# Patient Record
Sex: Male | Born: 1980 | Race: White | Hispanic: No | Marital: Married | Smoking: Former smoker
Health system: Southern US, Community
[De-identification: ages and names within clinical notes are randomized; demographics above are authoritative.]

## PROBLEM LIST (undated history)

## (undated) ENCOUNTER — Emergency Department: Payer: Medicaid Other | Source: Home / Self Care

## (undated) DIAGNOSIS — S61419A Laceration without foreign body of unspecified hand, initial encounter: Secondary | ICD-10-CM

## (undated) HISTORY — DX: Laceration without foreign body of unspecified hand, initial encounter: S61.419A

## (undated) HISTORY — PX: HAND SURGERY: SHX662

---

## 2016-09-12 ENCOUNTER — Emergency Department
Admission: EM | Admit: 2016-09-12 | Discharge: 2016-09-12 | Disposition: A | Discharge status: Home / Self Care | Payer: Medicaid Other | Attending: Emergency Medicine | Admitting: Emergency Medicine

## 2016-09-12 ENCOUNTER — Emergency Department: Payer: Medicaid Other

## 2016-09-12 ENCOUNTER — Encounter: Payer: Self-pay | Admitting: Intensive Care

## 2016-09-12 DIAGNOSIS — M5410 Radiculopathy, site unspecified: Secondary | ICD-10-CM

## 2016-09-12 DIAGNOSIS — M79604 Pain in right leg: Secondary | ICD-10-CM | POA: Diagnosis not present

## 2016-09-12 DIAGNOSIS — Z87891 Personal history of nicotine dependence: Secondary | ICD-10-CM | POA: Diagnosis not present

## 2016-09-12 MED ORDER — PREDNISONE 20 MG PO TABS
60.0000 mg | ORAL_TABLET | Freq: Once | ORAL | Status: AC
  Administered 2016-09-12: 60 mg via ORAL
  Filled 2016-09-12: qty 3

## 2016-09-12 MED ORDER — PREDNISONE 10 MG PO TABS: ORAL_TABLET | ORAL | 0 refills | Status: AC

## 2016-09-12 MED ORDER — CYCLOBENZAPRINE HCL 5 MG PO TABS: 5.0000 mg | ORAL_TABLET | Freq: Every day | ORAL | 0 refills | Status: DC

## 2016-09-12 NOTE — ED Notes (Signed)
Pt reports right leg pain at the calf for the last 3-4 weeks with cramping, states worse as of late at the medial calf, unable to dorsiflex foot,

## 2016-09-12 NOTE — ED Provider Notes (Signed)
[facility] Emergency Department Provider Note ____________________________________________  Time seen: 2030  I have reviewed the triage vital signs and the nursing notes.  HISTORY  Chief Complaint  Leg Pain (right)  HPI Dwayne Adams is a 36 y.o. male presents to the ED, at the urging of his wife, for evaluation of 3-4 week complaint of right lateral calf pain. Patient also describes some discomfort to the lateral right thigh. He does note that his pain is worse with ambulation. He also notes that he has difficulty raising his toes when he walks. He said a few occasions where he stubbed his toe, and fall on a nearly fallen while walking over the last 3 weeks. He denies any injury, accident, or trauma. He denies any distal paresthesias or foot drop. The patient is here with concerns for a blood clot to his lower leg. He was firstaware of his difficulty with raising the foot, when he was driving, had a tough time articulating his foot to the gas and brake pedals. He gives a remote history of a L1 lumbar compression fracture about 18 years prior. The injury occurred after a trampoline accident. He suffered no lasting sequelae. He reports being in a clamshell brace for about 8 months. No surgical intervention and no ongoing pain management.   History reviewed. No pertinent past medical history.  There are no active problems to display for this patient.  Past Surgical History:  Procedure Laterality Date  . HAND SURGERY Right     Prior to Admission medications   Medication Sig Start Date End Date Taking? Authorizing Provider  cyclobenzaprine (FLEXERIL) 5 MG tablet Take 1 tablet (5 mg total) by mouth at bedtime. 09/12/16   Delquan Poucher, Charlesetta IvoryJenise V Bacon, PA-C  predniSONE (DELTASONE) 10 MG tablet Take 5 tabs on Day 1 Take 4 tabs on Day 2 Take 3 tabs on Day 3 Take 2 tabs on Day 5  Take 1 tab on Day 6 09/12/16 09/18/16  Zadie Deemer, Charlesetta IvoryJenise V Bacon, PA-C    Allergies Patient has no  known allergies.  History reviewed. No pertinent family history.  Social History Social History  Substance Use Topics  . Smoking status: Former Smoker    Types: Cigarettes  . Smokeless tobacco: Never Used  . Alcohol use No    Review of Systems  Constitutional: Negative for fever. Respiratory: Negative for shortness of breath. Gastrointestinal: Negative for abdominal pain, vomiting and diarrhea. Musculoskeletal: Negative for back pain. Right leg and calf pain as above.  Skin: Negative for rash. Neurological: Negative for headaches, focal weakness or numbness. ____________________________________________  PHYSICAL EXAM:  VITAL SIGNS: ED Triage Vitals  Enc Vitals Group     BP 09/12/16 1818 119/87     Pulse Rate 09/12/16 1818 79     Resp 09/12/16 1818 16     Temp 09/12/16 1818 98.1 F (36.7 C)     Temp Source 09/12/16 1818 Oral     SpO2 09/12/16 1818 98 %     Weight 09/12/16 1817 151 lb (68.5 kg)     Height 09/12/16 1817 6' (1.829 m)     Head Circumference --      Peak Flow --      Pain Score 09/12/16 1816 2     Pain Loc --      Pain Edu? --      Excl. in GC? --     Constitutional: Alert and oriented. Well appearing and in no distress. Head: Normocephalic and atraumatic. Cardiovascular: Normal rate,  regular rhythm. Normal distal pulses. Respiratory: Normal respiratory effort. No wheezes/rales/rhonchi. Musculoskeletal: Right lower extremity without any obvious deformity, dislocation, or effusion. Patient with normal knee exam without internal derangement. He is also noted to have normal palpable calf and Achilles exam. Negative Thompson test. Patient is mildly tender palpation to the anterior tibialis of the right lotion many. He has normal dorsiflexion of the foot, but diminished plantar flexion. Nontender with normal range of motion in all extremities.  Neurologic: Cranial nerves II through XII grossly intact. Normal LE DTRs bilaterally. Antalgic gait without ataxia.  Normal speech and language. No gross focal neurologic deficits are appreciated. Skin:  Skin is warm, dry and intact. No rash noted. ____________________________________________   RADIOLOGY  RLE Ultrasound/Doppler  IMPRESSION: No evidence of DVT within the right lower extremity.  CT Lumbar Spine  IMPRESSION: 1. Six lumbar type non-rib-bearing vertebral bodies. 2. L1 age-indeterminate superior compression deformity with 40% anterior loss of height. No bony retropulsion. 3. Mild discogenic degenerative changes at L4-5 through L6-S1. No significant bony or foraminal stenosis identified. ____________________________________________  PROCEDURES  Decadron 60 mg PO ____________________________________________  INITIAL IMPRESSION / ASSESSMENT AND PLAN / ED COURSE  Patient was ED evaluation of radicular LE symptoms on the right leg. His pain to the anterior tibialis musculature and decreased dorsiflexion of the right foot. Symptoms may represent a nerve impingement from the lumbar spine. A CT scan is overall reassuring at this time. The patient has a congenital sixth lumbar vertebrate. He has a stable L1 compression wedge fracture and only mild degenerative changes from L4-5 through L6-S1.He will be discharged with a prescription for Decadron to dose the taper over the next 5 days. He is also given a perception for cyclobenzaprine to dose for muscle spasm. He is given crutches to help with ambulation, and an Ace bandage to the calf and ankle for support. He is advised to [facility] or return to the ED in 1 week if symptoms do not improve significantly. ____________________________________________  FINAL CLINICAL IMPRESSION(S) / ED DIAGNOSES  Final diagnoses:  Right leg pain  Radicular pain of right lower extremity      Maxie Debose, Charlesetta IvoryJenise V Bacon, PA-C 09/13/16 0103    Minna AntisPaduchowski, Kevin, MD 09/15/16 [phone]

## 2016-09-12 NOTE — ED Triage Notes (Addendum)
Patient presents today with R calf pain and R thigh X 3-4 weeks. Denies injury. Reports pain is worse with ambulating. No redness or warmth noted

## 2016-09-12 NOTE — Discharge Instructions (Signed)
Your symptoms are consistent with some irritation to the nerves that supply your foot and ankle. You should take the prescription steroids as directed and muscle relaxant as needed. Follow[facility] for continued symptoms. Return to the ED during the week for worsening symptoms.

## 2017-02-10 DIAGNOSIS — S61419A Laceration without foreign body of unspecified hand, initial encounter: Secondary | ICD-10-CM

## 2017-02-10 HISTORY — DX: Laceration without foreign body of unspecified hand, initial encounter: S61.419A

## 2017-02-25 ENCOUNTER — Encounter: Payer: Self-pay | Admitting: Emergency Medicine

## 2017-02-25 ENCOUNTER — Emergency Department
Admission: EM | Admit: 2017-02-25 | Discharge: 2017-02-25 | Disposition: A | Discharge status: Home / Self Care | Payer: Worker's Compensation | Attending: Emergency Medicine | Admitting: Emergency Medicine

## 2017-02-25 DIAGNOSIS — F17290 Nicotine dependence, other tobacco product, uncomplicated: Secondary | ICD-10-CM | POA: Diagnosis not present

## 2017-02-25 DIAGNOSIS — X58XXXD Exposure to other specified factors, subsequent encounter: Secondary | ICD-10-CM | POA: Insufficient documentation

## 2017-02-25 DIAGNOSIS — S61412D Laceration without foreign body of left hand, subsequent encounter: Secondary | ICD-10-CM | POA: Diagnosis not present

## 2017-02-25 DIAGNOSIS — S61411D Laceration without foreign body of right hand, subsequent encounter: Secondary | ICD-10-CM | POA: Insufficient documentation

## 2017-02-25 DIAGNOSIS — S61411A Laceration without foreign body of right hand, initial encounter: Secondary | ICD-10-CM

## 2017-02-25 DIAGNOSIS — M79642 Pain in left hand: Secondary | ICD-10-CM | POA: Diagnosis present

## 2017-02-25 MED ORDER — OXYCODONE-ACETAMINOPHEN 5-325 MG PO TABS
1.0000 | ORAL_TABLET | Freq: Once | ORAL | Status: AC
  Administered 2017-02-25: 1 via ORAL
  Filled 2017-02-25: qty 1

## 2017-02-25 MED ORDER — TRAMADOL HCL 50 MG PO TABS: 50.0000 mg | ORAL_TABLET | Freq: Four times a day (QID) | ORAL | 0 refills | Status: AC | PRN

## 2017-02-25 NOTE — ED Provider Notes (Signed)
[facility] Emergency Department Provider Note  ____________________________________________  Time seen: Approximately 10:28 AM  I have reviewed the triage vital signs and the nursing notes.   HISTORY  Chief Complaint Laceration    HPI Dwayne Adams is a 37 y.o. male that presents to the emergency department for dressing change.  Patient cut his hand yesterday while at work.  [facility] with [facility].  Patient states that wounds are painful and was afraid to have his wife change his dressings.  He has been taking her 800 mg tablets of ibuprofen, which is not controlling the pain. He is taking doxycycline.   No numbness, tingling.  History reviewed. No pertinent past medical history.  There are no active problems to display for this patient.   Past Surgical History:  Procedure Laterality Date  . HAND SURGERY Right     Prior to Admission medications   Medication Sig Start Date End Date Taking? Authorizing Provider  cyclobenzaprine (FLEXERIL) 5 MG tablet Take 1 tablet (5 mg total) by mouth at bedtime. 09/12/16   Menshew, Charlesetta IvoryJenise V Bacon, PA-C  traMADol (ULTRAM) 50 MG tablet Take 1 tablet (50 mg total) by mouth every 6 (six) hours as needed. 02/25/17 02/25/18  Enid DerryWagner, Ephrem Carrick, PA-C    Allergies Patient has no known allergies.  History reviewed. No pertinent family history.  Social History Social History   Tobacco Use  . Smoking status: Current Every Day Smoker    Types: E-cigarettes  . Smokeless tobacco: Never Used  Substance Use Topics  . Alcohol use: No  . Drug use: No     Review of Systems  Constitutional: No fever/chills Cardiovascular: No chest pain. Respiratory: No SOB. Gastrointestinal: No abdominal pain.  No nausea, no vomiting.  Musculoskeletal: Positive for hand pain. Skin: Negative for rash, ecchymosis.  Positive for laceration.   ____________________________________________   PHYSICAL  EXAM:  VITAL SIGNS: ED Triage Vitals  Enc Vitals Group     BP 02/25/17 0937 136/87     Pulse Rate 02/25/17 0937 79     Resp 02/25/17 0937 18     Temp 02/25/17 0937 97.7 F (36.5 C)     Temp Source 02/25/17 0937 Oral     SpO2 02/25/17 0937 100 %     Weight 02/25/17 0938 160 lb (72.6 kg)     Height 02/25/17 0938 6' (1.829 m)     Head Circumference --      Peak Flow --      Pain Score 02/25/17 0938 9     Pain Loc --      Pain Edu? --      Excl. in GC? --      Constitutional: Alert and oriented. Well appearing and in no acute distress. Eyes: Conjunctivae are normal. PERRL. EOMI. Head: Atraumatic. ENT:      Ears:      Nose: No congestion/rhinnorhea.      Mouth/Throat: Mucous membranes are moist.  Neck: No stridor.  Cardiovascular: Good peripheral circulation. Respiratory: Normal respiratory effort without tachypnea or retractions. Musculoskeletal: Full range of motion to all extremities. No gross deformities appreciated. Neurologic:  Normal speech and language. No gross focal neurologic deficits are appreciated.  Skin:  Skin is warm, dry and intact. Well healing lacerations to palms of right and left hand with sutures in place. No discharge or surrounding erythema.  ____________________________________________   LABS (all labs ordered are listed, but only abnormal results are displayed)  Labs Reviewed -  No data to display ____________________________________________  EKG   ____________________________________________  RADIOLOGY   No results found.  ____________________________________________    PROCEDURES  Procedure(s) performed:    Procedures    Medications  oxyCODONE-acetaminophen (PERCOCET/ROXICET) 5-325 MG per tablet 1 tablet (1 tablet Oral Given 02/25/17 1107)     ____________________________________________   INITIAL IMPRESSION / ASSESSMENT AND PLAN / ED COURSE  Pertinent labs & imaging results that were available during my care of the  patient were reviewed by me and considered in my medical decision making (see chart for details).  Review of the [place] CSRS was performed in accordance of the [facility] prior to dispensing any controlled drugs.     Patient's diagnosis is consistent with dressing change.  Vital signs and exam are reassuring.  Lacerations were cleaned and dressing was changed.  Patient has an appointment tomorrow to follow-up with provider that placed stitches.  No signs of infection.  Patient will be discharged home with prescriptions for a short course of tramadol. Patient is to follow up with PCP as directed. Patient is given ED precautions to return to the ED for any worsening or new symptoms.     ____________________________________________  FINAL CLINICAL IMPRESSION(S) / ED DIAGNOSES  Final diagnoses:  Laceration of left hand without foreign body, subsequent encounter  Laceration of right hand without foreign body, initial encounter      NEW MEDICATIONS STARTED DURING THIS VISIT:  ED Discharge Orders        Ordered    traMADol (ULTRAM) 50 MG tablet  Every 6 hours PRN     02/25/17 1116          This chart was dictated using voice recognition software/Dragon. Despite best efforts to proofread, errors can occur which can change the meaning. Any change was purely unintentional.    Enid Derry, PA-C 02/25/17 1545    Alphonzo Lemmings Rudy Jew, MD 02/26/17 [phone]

## 2017-02-25 NOTE — ED Notes (Signed)
Pt here for a bandage change and pain control. Pt reports cut his hands at work. Was treated [facility]. Pt received stitches in both hands and was given a prescription for antibiotics but nothing for pain. Pt reports has been taking his wife's 800mg  ibuprofen with no relief. Pt also needs his bandage changed. Pt significant other reports that [facility] and the area would be painful so the patient is scare to have her change the bandages. Bandages are in tact, no bleeding or discharge noted.

## 2017-02-25 NOTE — ED Notes (Signed)
First Nurse Note - Patient here with bilateral hand dressings.  States he was injured at work yesterday and [facility].  Dressings dry and intact.  Here today complaining of left hand pain and requesting dressing change.

## 2017-02-25 NOTE — ED Notes (Signed)
Bilateral hand wounds cleansed and sterile dressings applied.

## 2017-02-25 NOTE — ED Notes (Signed)
Pt alert and oriented X4, active, cooperative, pt in NAD. RR even and unlabored, color WNL.  Pt informed to return if any life threatening symptoms occur.  Discharge and followup instructions reviewed.  

## 2017-03-13 ENCOUNTER — Other Ambulatory Visit: Payer: Self-pay

## 2017-03-13 ENCOUNTER — Encounter: Payer: Self-pay | Admitting: Occupational Therapy

## 2017-03-13 ENCOUNTER — Ambulatory Visit: Payer: Worker's Compensation | Attending: Student | Admitting: Occupational Therapy

## 2017-03-13 DIAGNOSIS — R600 Localized edema: Secondary | ICD-10-CM | POA: Diagnosis present

## 2017-03-13 DIAGNOSIS — M79641 Pain in right hand: Secondary | ICD-10-CM | POA: Insufficient documentation

## 2017-03-13 DIAGNOSIS — M25641 Stiffness of right hand, not elsewhere classified: Secondary | ICD-10-CM | POA: Diagnosis present

## 2017-03-13 DIAGNOSIS — L905 Scar conditions and fibrosis of skin: Secondary | ICD-10-CM | POA: Diagnosis not present

## 2017-03-13 DIAGNOSIS — M6281 Muscle weakness (generalized): Secondary | ICD-10-CM | POA: Insufficient documentation

## 2017-03-13 NOTE — Patient Instructions (Signed)
Contrast to be done 3 x day  Scar massage ed on and cica scar pad provided for L palmar scar  And R palmar part of scar  Silicon digisleeve for 2nd digit for scar and compression  Scar pad for night time use   Blocked AROM for DIP and PIP of 2nd  Tendon glides AROM   Tapping ofr 2nd digit with palm  On table  need to feel slight pull  Less than 1-2/10 pain

## 2017-03-13 NOTE — Therapy (Signed)
[facility] [address] [place], [address] Phone: [phone]   Fax:  [phone]  Occupational Therapy Evaluation  Patient Details  Name: Dwayne Adams MRN: [id number] Date of Birth: [birth year] Referring Provider: Marney Doctor   Encounter Date: 03/13/2017  OT End of Session - 03/13/17 1749    Visit Number  1    Number of Visits  9    Date for OT Re-Evaluation  04/17/17    Authorization - Visit Number  1    Authorization - Number of Visits  9    OT Start Time  1350    OT Stop Time  1455    OT Time Calculation (min)  65 min    Activity Tolerance  Patient tolerated treatment well    Behavior During Therapy  [facility] for tasks assessed/performed       Past Medical History:  Diagnosis Date  . Laceration of hand 02/2017   R hand     Past Surgical History:  Procedure Laterality Date  . HAND SURGERY Right     There were no vitals filed for this visit.  Subjective Assessment - 03/13/17 1741    Subjective   I cut my hand at work on 15th Jan - the L hand 2 cuts were not as deep and healed fine - but the R hand is tender , painfulll to move and  I cannot bend or straighten my finger -  and tingling in the whole finger - no numb - cannot use it     Patient Stated Goals  I want to be able to use my R dominant hand - and that index finger to go back to work , play with my kids and do things around the house     Currently in Pain?  Yes    Pain Score  3  but increase to 8 with use or movement    Pain Location  Hand    Pain Orientation  Right    Pain Descriptors / Indicators  Tender;Tingling;Stabbing    Pain Type  Acute pain;Surgical pain    Pain Onset  1 to 4 weeks ago        [facility] OT Assessment - 03/13/17 0001      Assessment   Medical Diagnosis  Laceration of R hand    Referring Provider  McGhee    Onset Date/Surgical Date  02/24/17    Hand Dominance  Right    Next MD Visit  -- 3rd week in [facility]   Environment   Lives With  Family      Prior Function   Vocation  Full time employment    Leisure  work as Personnel officer, Retail banker , play with kids, work around [employer] , fish , yard work       Edema   Edema  proximal phalanges of 2nd increase by 1 cm       Right Hand AROM   R Index  MCP 0-90  60 Degrees extention -35    R Index PIP 0-100  70 Degrees -18 ext    R Index DIP 0-70  35 Degrees       Pt arrive with some dry skin and scab - was removed and cut at L palmar scar and dorsal side of R 2nd digit  Contrast done in clinic prior to ed on HEP    Contrast to be done 3 x  day  Scar massage ed on and cica scar pad provided for L palmar scar  And R palmar part of scar  Silicon digisleeve for 2nd digit for scar and compression  Scar pad for night time use   Blocked AROM for DIP and PIP of 2nd  Tendon glides AROM   Tapping ofr 2nd digit with palm  On table  need to feel slight pull  Less than 1-2/10 pain                 OT Education - 03/13/17 1749    Education provided  Yes    Education Details  Findings of eval and HEP     Person(s) Educated  Patient    Methods  Explanation;Tactile cues;Verbal cues;Handout;Demonstration    Comprehension  Returned demonstration       OT Short Term Goals - 03/13/17 1758      OT SHORT TERM GOAL #1   Title  Pain on PRWHE improve with more than 20 points     Baseline  at eval pain on PRWHE 31/50     Time  3    Period  Weeks    Status  New    Target Date  04/03/17      OT SHORT TERM GOAL #2   Title  Pt to be ind in HEP to decrease pain, and edema - and increase ROM and functional use of R hand without increase symptoms     Baseline  no knowledge on HEP     Time  2    Period  Weeks    Status  New    Target Date  03/27/17        OT Long Term Goals - 03/13/17 1802      OT LONG TERM GOAL #1   Title  R hand AROM in 2nd digit improve to flexion and extention WNL to grip ADL's objects and put hand in pocket      Baseline  see flowsheet for extnetion and flexion ROM     Time  4    Period  Weeks    Status  New    Target Date  04/17/17      OT LONG TERM GOAL #2   Title  R Grip strength  and prehension increase to more than 50% compare to L hand to return to work     Baseline  NT - pain between 3-8out of 10 - scar tender     Time  4    Period  Weeks    Status  New    Target Date  04/17/17      OT LONG TERM GOAL #3   Title  Function score on PRWHE improve with more than 30 points     Baseline  function score on PRWHE is 44/50 at eval     Time  4    Period  Weeks    Status  New    Target Date  04/17/17            Plan - 03/13/17 1751    Clinical Impression Statement  Pt present  2 1/2 wks out from lacerations in bilateral hands - with R hand worse than L - pt had full ROM in L with some tendernss over palmar scar - pt refer to hand therapy for R hand - pt show increase edema and pain and tenderness over laceration - on radial side of 2nd  MC - decrease ROM of 2nd  digit  in flexion and extention - limiting his use of R hand in ADl's and IADL's - pt to hold of on PROM and need to decrease edema , pain and improve scar tissue first  over the next few days      Occupational performance deficits (Please refer to evaluation for details):  ADL's;IADL's;Work;Play;Leisure    Rehab Potential  Good    OT Frequency  2x / week    OT Duration  4 weeks    OT Treatment/Interventions  Fluidtherapy;Self-care/ADL training;Contrast Bath;Scar mobilization;Therapeutic exercise;Ultrasound;Paraffin;Manual Therapy;Passive range of motion    Plan  assess progress with HEP and adjust as needed     Clinical Decision Making  Multiple treatment options, significant modification of task necessary    OT Home Exercise Plan  see pt instruction     Consulted and Agree with Plan of Care  Patient       Patient will benefit from skilled therapeutic intervention in order to improve the following deficits and impairments:   Pain, Impaired flexibility, Increased edema, Decreased skin integrity, Decreased scar mobility, Decreased strength, Impaired UE functional use, Decreased range of motion  Visit Diagnosis: Scar condition and fibrosis of skin - Plan: Ot plan of care cert/re-cert  Pain in right hand - Plan: Ot plan of care cert/re-cert  Localized edema - Plan: Ot plan of care cert/re-cert  Stiffness of right hand, not elsewhere classified - Plan: Ot plan of care cert/re-cert  Muscle weakness (generalized) - Plan: Ot plan of care cert/re-cert    Problem List There are no active problems to display for this patient.   Oletta CohnuPreez, Raymel Cull OTR/L,CLT 03/13/2017, 6:08 PM  [place] [facility] [address] [place], [place], [id number] Phone: [phone]   Fax:  [phone]  Name: Dwayne GarfinkelMichael Adams MRN: [id number] Date of Birth: [birth year]

## 2017-03-16 ENCOUNTER — Ambulatory Visit: Payer: Worker's Compensation | Admitting: Occupational Therapy

## 2017-03-16 DIAGNOSIS — L905 Scar conditions and fibrosis of skin: Secondary | ICD-10-CM

## 2017-03-16 DIAGNOSIS — R600 Localized edema: Secondary | ICD-10-CM

## 2017-03-16 DIAGNOSIS — M79641 Pain in right hand: Secondary | ICD-10-CM

## 2017-03-16 DIAGNOSIS — M6281 Muscle weakness (generalized): Secondary | ICD-10-CM

## 2017-03-16 DIAGNOSIS — M25641 Stiffness of right hand, not elsewhere classified: Secondary | ICD-10-CM

## 2017-03-16 NOTE — Therapy (Signed)
[facility] MEDICINE [address] [place], [place], [address] Phone: [phone]   Fax:  [phone]  Occupational Therapy Treatment  Patient Details  Name: Dwayne Adams MRN: [id number] Date of Birth: [birth year] Referring Provider: Marney DoctorMcGhee   Encounter Date: 03/16/2017  OT End of Session - 03/16/17 0954    Visit Number  2    Number of Visits  9    Date for OT Re-Evaluation  04/17/17    Authorization - Visit Number  2    Authorization - Number of Visits  9    OT Start Time  0915    OT Stop Time  0950    OT Time Calculation (min)  35 min    Activity Tolerance  Patient tolerated treatment well    Behavior During Therapy  [facility] for tasks assessed/performed       Past Medical History:  Diagnosis Date  . Laceration of hand 02/2017   R hand     Past Surgical History:  Procedure Laterality Date  . HAND SURGERY Right     There were no vitals filed for this visit.  Subjective Assessment - 03/16/17 0928    Subjective   Much better - not as tender, pain is better, scar is not as tender - Range of motion is better - still cannot make tight fist     Patient Stated Goals  I want to be able to use my R dominant hand - and that index finger to go back to work , play with my kids and do things around the house     Currently in Pain?  Yes    Pain Score  2     Pain Location  Hand    Pain Orientation  Right    Pain Descriptors / Indicators  Tightness;Tender    Pain Type  Acute pain         [facility] OT Assessment - 03/16/17 0001      Right Hand AROM   R Index  MCP 0-90  80 Degrees -20    R Index PIP 0-100  100 Degrees 0    R Index DIP 0-70  55 Degrees       Measurements taken for AROM in 2nd digit all joints  - in R hand  Great progress in extention and flexion   still increase edema   No flowsheet data found.  0.6 cm at proximal phalanges          OT Treatments/Exercises (OP) - 03/16/17 0001      RUE Fluidotherapy    Number Minutes Fluidotherapy  8 Minutes    RUE Fluidotherapy Location  Hand    Comments  AROM for fisting in all planes prior to  session           Scar massage done - great progress - still tenderness over  Volar 2nd MC and at base of 2nd digit - but great progress  Provided new cica scar pad and silicon digi sleeve  Acar pad for night time use   Blocked AROM for DIP and PIP of 2nd  Tendon glides AROM   Tapping ofr 2nd digit with palm  On table  and all 4   need to feel slight pull-  but keep pain under 1-2/10        OT Education - 03/16/17 0954    Education Details  Same HEP - but still same goal and use contrast - to keep edema  under control    Person(s) Educated  Patient    Methods  Explanation;Demonstration;Tactile cues    Comprehension  Verbalized understanding;Returned demonstration       OT Short Term Goals - 03/13/17 1758      OT SHORT TERM GOAL #1   Title  Pain on PRWHE improve with more than 20 points     Baseline  at eval pain on PRWHE 31/50     Time  3    Period  Weeks    Status  New    Target Date  04/03/17      OT SHORT TERM GOAL #2   Title  Pt to be ind in HEP to decrease pain, and edema - and increase ROM and functional use of R hand without increase symptoms     Baseline  no knowledge on HEP     Time  2    Period  Weeks    Status  New    Target Date  03/27/17        OT Long Term Goals - 03/13/17 1802      OT LONG TERM GOAL #1   Title  R hand AROM in 2nd digit improve to flexion and extention WNL to grip ADL's objects and put hand in pocket     Baseline  see flowsheet for extnetion and flexion ROM     Time  4    Period  Weeks    Status  New    Target Date  04/17/17      OT LONG TERM GOAL #2   Title  R Grip strength  and prehension increase to more than 50% compare to L hand to return to work     Baseline  NT - pain between 3-8out of 10 - scar tender     Time  4    Period  Weeks    Status  New    Target Date  04/17/17      OT  LONG TERM GOAL #3   Title  Function score on PRWHE improve with more than 30 points     Baseline  function score on PRWHE is 44/50 at eval     Time  4    Period  Weeks    Status  New    Target Date  04/17/17            Plan - 03/16/17 0955    Clinical Impression Statement  Pt is about 3 wks out from lacaration - pt showed great progress over the weekend in HEP to decrease edema, pain and scar tissue - increase AROM at [facility], PIP and DIP - still not night fist or full AROM for extention walking in- showed increase ROM during sesssion but also increase edema - reinforce for pt to use contrast still     Occupational performance deficits (Please refer to evaluation for details):  ADL's;IADL's;Work;Play;Leisure    Rehab Potential  Good    OT Frequency  2x / week    OT Duration  4 weeks    OT Treatment/Interventions  Fluidtherapy;Self-care/ADL training;Contrast Bath;Scar mobilization;Therapeutic exercise;Ultrasound;Paraffin;Manual Therapy;Passive range of motion    Plan  asses progress - edema and ROM /pain - how did work go     Optometrist  Multiple treatment options, significant modification of task necessary    OT Home Exercise Plan  see pt instruction     Consulted and Agree with Plan of Care  Patient  Patient will benefit from skilled therapeutic intervention in order to improve the following deficits and impairments:  Pain, Impaired flexibility, Increased edema, Decreased skin integrity, Decreased scar mobility, Decreased strength, Impaired UE functional use, Decreased range of motion  Visit Diagnosis: Scar condition and fibrosis of skin  Pain in right hand  Localized edema  Stiffness of right hand, not elsewhere classified  Muscle weakness (generalized)    Problem List There are no active problems to display for this patient.   Consuelo Suthers OTR/L, CLT 03/16/2017, 9:57 AM  [facility]  MEDICINE [address], [place], [address] Phone: [phone]   Fax:  [phone]  Name: Dwayne Adams MRN: [id number] Date of Birth: 10/15/1980

## 2017-03-16 NOTE — Patient Instructions (Signed)
Same exercises - but do contrast - for edema still

## 2017-03-18 ENCOUNTER — Ambulatory Visit: Payer: Worker's Compensation | Admitting: Occupational Therapy

## 2017-04-02 ENCOUNTER — Ambulatory Visit: Payer: Worker's Compensation | Admitting: Occupational Therapy

## 2018-01-29 IMAGING — CT CT L SPINE W/O CM
3 series · 9 of 33 positions shown, 11 images · non-contrast
Comparison: None.

CLINICAL DATA: 36 y/o M; right leg pain SP calf for 3-4 weeks,
unable to dorsiflex. Back pain.

EXAM:
CT LUMBAR SPINE WITHOUT CONTRAST
TECHNIQUE: Multidetector CT imaging of the lumbar spine was performed without
intravenous contrast administration. Multiplanar CT image
reconstructions were also generated.

[Series 4: l spine soft · axial · 0.31mm/px · z∈[-311,-311]mm · 1 of 134 slices shown, 2 images]
[im 72/134  soft-tissue]
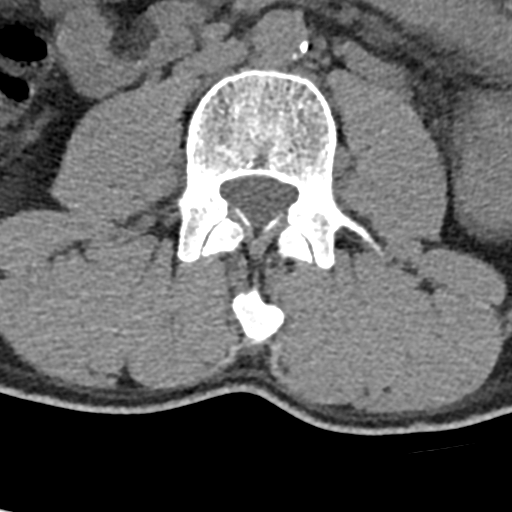
[im 72/134  bone]
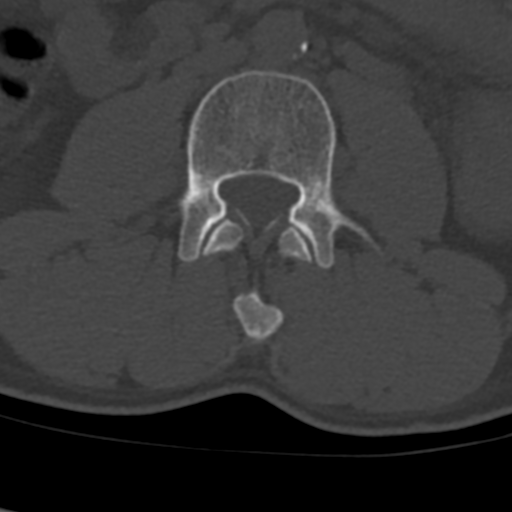

[Series 7: sagittal bone · sagittal · 0.29mm/px · 5 of 67 slices shown, 6 images]
[im 23/67  bone]
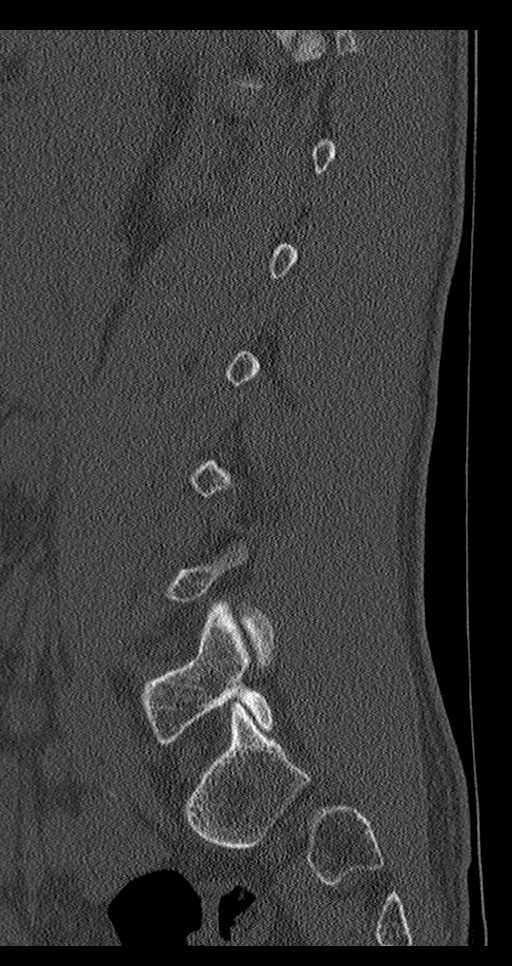
[im 28/67  bone]
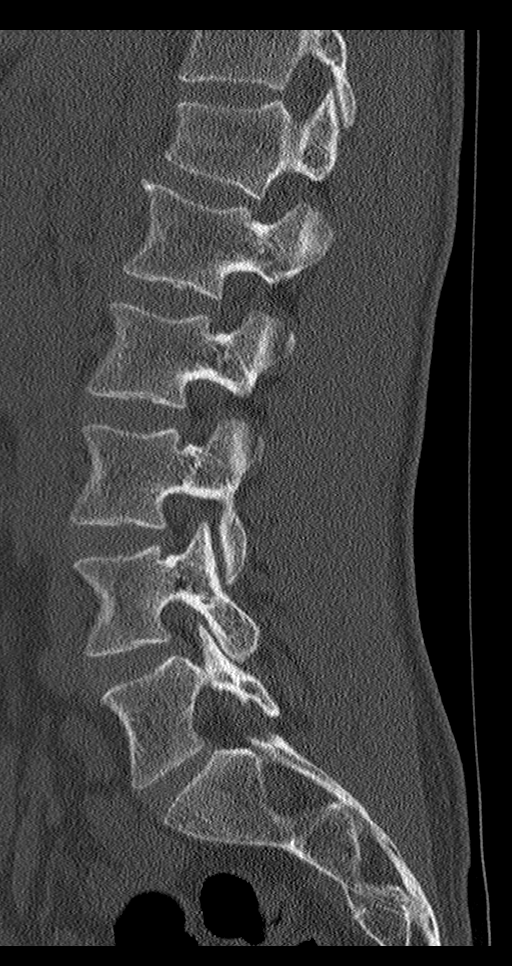
[im 34/67  soft-tissue]
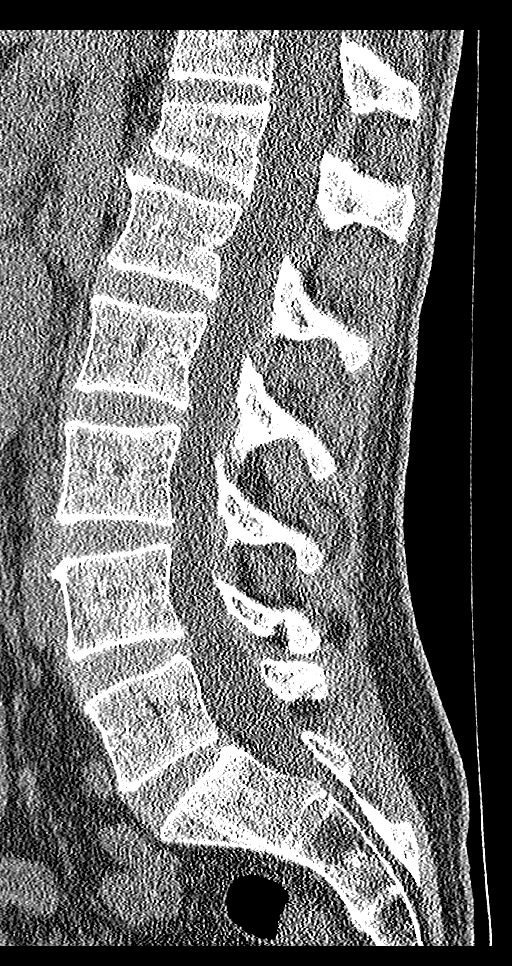
[im 34/67  bone]
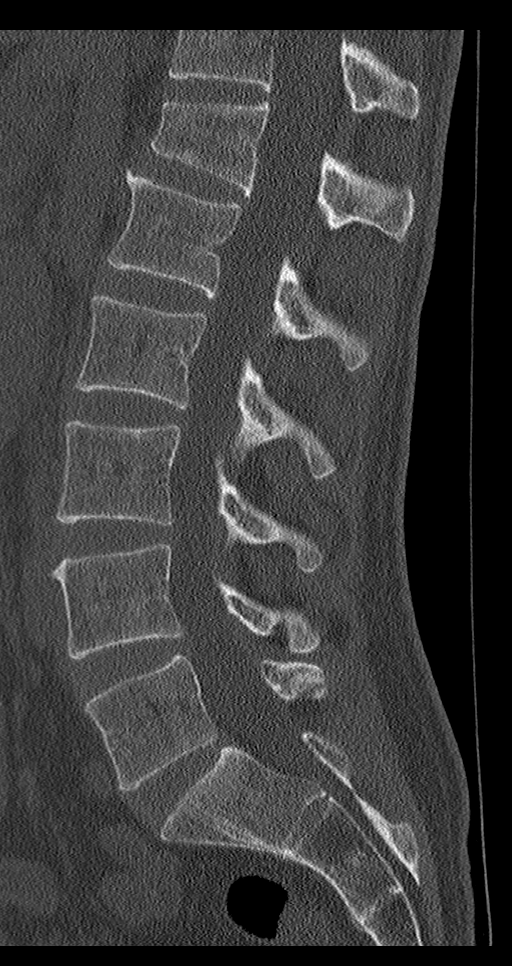
[im 39/67  bone]
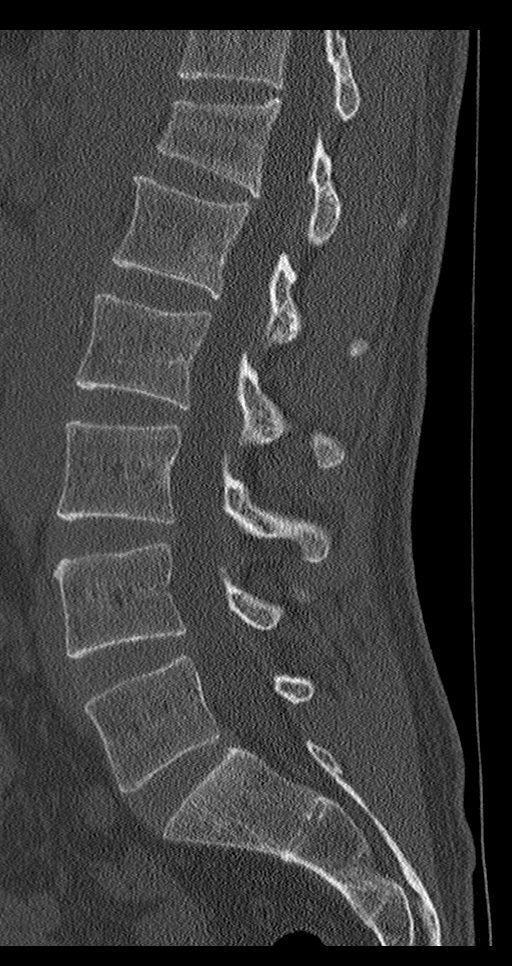
[im 45/67  bone]
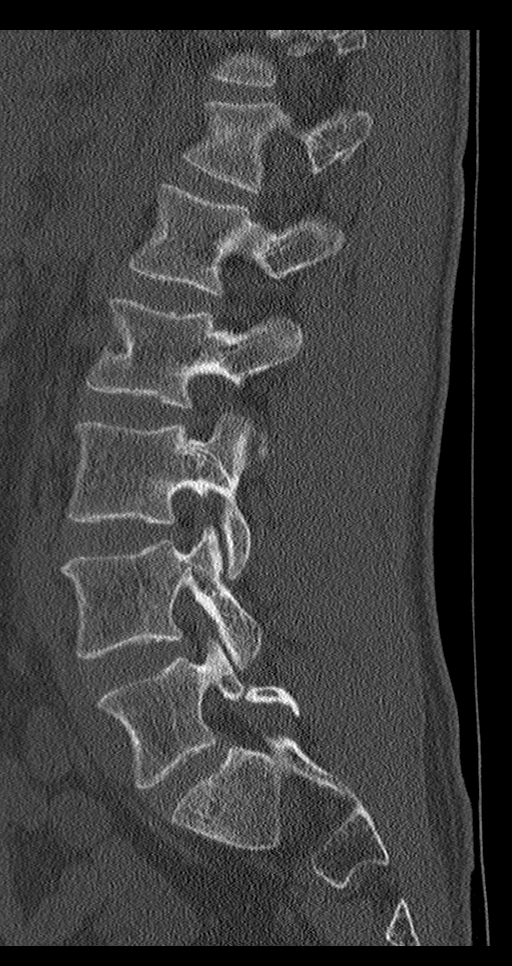

[Series 8: coronal bone · coronal · 0.26mm/px · 3 of 62 slices shown]
[im 13/62  bone]
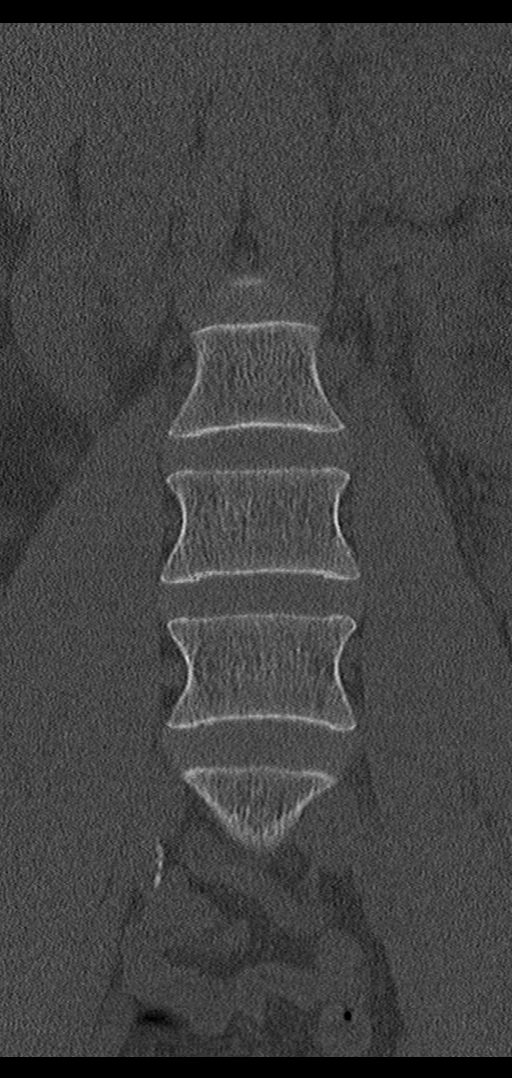
[im 25/62  bone]
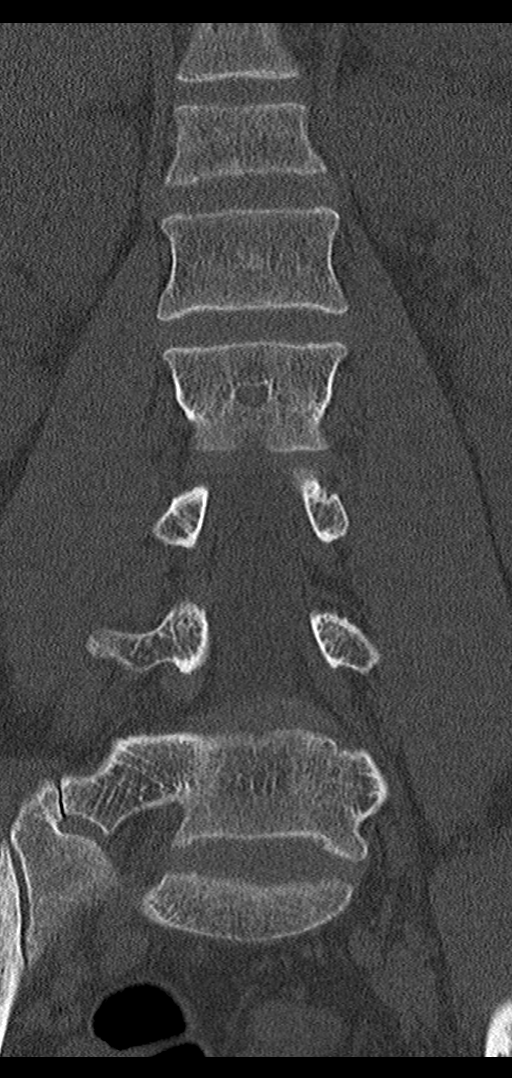
[im 37/62  bone]
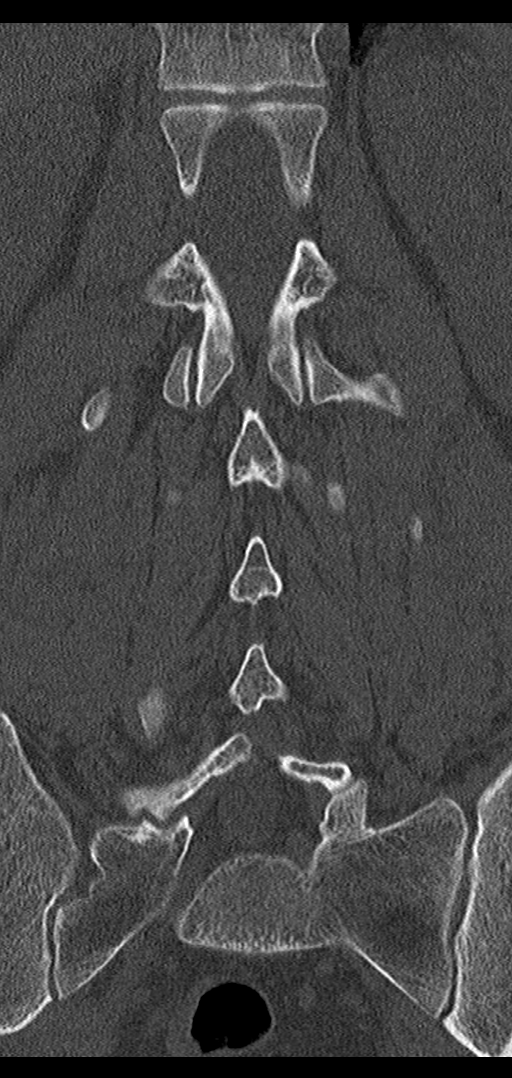

[9 of 33 positions shown; findings below may reference images not displayed]

FINDINGS: Segmentation: 6 lumbar type non-rib-bearing vertebral bodies.
Articulation of the L6 right transverse process to the sacral ala.
Incomplete fusion of posterior elements of L6.

Alignment: Normal.

Vertebrae: L1 superior endplate deformity with 40% anterior loss of
height. Vertebral body heights are otherwise well preserved. Chronic
left L5 pars defect.

Paraspinal and other soft tissues: Mild calcific atherosclerosis of
the abdominal aorta.

Disc levels:

Small disc bulges are present at the L4-5, L5-L6, and L6-S1 levels.
There is no significant foraminal or canal stenosis.
IMPRESSION: 1. Six lumbar type non-rib-bearing vertebral bodies.
2. L1 age-indeterminate superior compression deformity with 40%
anterior loss of height. No bony retropulsion.
3. Mild discogenic degenerative changes at L4-5 through L6-S1. No
significant bony or foraminal stenosis identified.

By: Katarinca Abrashi M.D.

## 2018-02-23 ENCOUNTER — Other Ambulatory Visit: Payer: Self-pay

## 2018-02-23 ENCOUNTER — Emergency Department
Admission: EM | Admit: 2018-02-23 | Discharge: 2018-02-23 | Disposition: A | Discharge status: Home / Self Care | Payer: Self-pay | Attending: Emergency Medicine | Admitting: Emergency Medicine

## 2018-02-23 ENCOUNTER — Emergency Department: Payer: Self-pay

## 2018-02-23 DIAGNOSIS — M5442 Lumbago with sciatica, left side: Secondary | ICD-10-CM | POA: Insufficient documentation

## 2018-02-23 DIAGNOSIS — M5441 Lumbago with sciatica, right side: Secondary | ICD-10-CM | POA: Insufficient documentation

## 2018-02-23 DIAGNOSIS — F17290 Nicotine dependence, other tobacco product, uncomplicated: Secondary | ICD-10-CM | POA: Insufficient documentation

## 2018-02-23 DIAGNOSIS — G8929 Other chronic pain: Secondary | ICD-10-CM

## 2018-02-23 MED ORDER — METHYLPREDNISOLONE 4 MG PO TBPK: ORAL_TABLET | ORAL | 0 refills | Status: DC

## 2018-02-23 MED ORDER — OXYCODONE-ACETAMINOPHEN 5-325 MG PO TABS: 1.0000 | ORAL_TABLET | Freq: Four times a day (QID) | ORAL | 0 refills | Status: DC | PRN

## 2018-02-23 MED ORDER — CYCLOBENZAPRINE HCL 10 MG PO TABS: 10.0000 mg | ORAL_TABLET | Freq: Three times a day (TID) | ORAL | 0 refills | Status: DC | PRN

## 2018-02-23 NOTE — ED Triage Notes (Signed)
States injured back a few years ago, states pain has come and gone since then. States about a week ago pain came back. Pt appears uncomfortable and stiff. States back spasms.

## 2018-02-23 NOTE — ED Notes (Signed)
See triage note  Presents with lower/mid back pain  States pain started about 1 week ago  States he had an injury several years ago has has had intermittent pain  Ambulates slowly d/t pain

## 2018-02-23 NOTE — ED Provider Notes (Signed)
[facility] Emergency Department Provider Note   ____________________________________________   First MD Initiated Contact with Patient 02/23/18 1509     (approximate)  I have reviewed the triage vital signs and the nursing notes.   HISTORY  Chief Complaint Back Pain   HPI Dwayne Adams is a 38 y.o. male patient complain of 1 week of low back pain.  Patient had pain has increased in the last 2 days.  Patient state also having spasms especially coming from a sitting to standing position.  Patient has a history of chronic back pain secondary to compression deformity of L1 and discogenic changes in L4-S1 as seen on CT of the lumbar spine in August 2018.  Patient denies bladder or bowel dysfunction.  Patient states there is a radicular component to bilateral lower extremities.  Patient rates pain as a 9/10.  No relief over-the-counter anti-inflammatory medications.  Past Medical History:  Diagnosis Date  . Laceration of hand 02/2017   R hand     There are no active problems to display for this patient.   Past Surgical History:  Procedure Laterality Date  . HAND SURGERY Right     Prior to Admission medications   Medication Sig Start Date End Date Taking? Authorizing Provider  cyclobenzaprine (FLEXERIL) 10 MG tablet Take 1 tablet (10 mg total) by mouth 3 (three) times daily as needed. 02/23/18   Joni Reining, PA-C  cyclobenzaprine (FLEXERIL) 5 MG tablet Take 1 tablet (5 mg total) by mouth at bedtime. 09/12/16   Menshew, Charlesetta Ivory, PA-C  methylPREDNISolone (MEDROL DOSEPAK) 4 MG TBPK tablet Take Tapered dose as directed 02/23/18   Joni Reining, PA-C  oxyCODONE-acetaminophen (PERCOCET) 5-325 MG tablet Take 1 tablet by mouth every 6 (six) hours as needed for severe pain. 02/23/18 02/23/19  Joni Reining, PA-C  traMADol (ULTRAM) 50 MG tablet Take 1 tablet (50 mg total) by mouth every 6 (six) hours as needed. 02/25/17 02/25/18  Enid Derry, PA-C     Allergies Patient has no known allergies.  History reviewed. No pertinent family history.  Social History Social History   Tobacco Use  . Smoking status: Current Every Day Smoker    Types: E-cigarettes  . Smokeless tobacco: Never Used  Substance Use Topics  . Alcohol use: No  . Drug use: No    Review of Systems Constitutional: No fever/chills Eyes: No visual changes. ENT: No sore throat. Cardiovascular: Denies chest pain. Respiratory: Denies shortness of breath. Gastrointestinal: No abdominal pain.  No nausea, no vomiting.  No diarrhea.  No constipation. Genitourinary: Negative for dysuria. Musculoskeletal: Positive for back pain. Skin: Negative for rash. Neurological: Negative for headaches, focal weakness or numbness.   ____________________________________________   PHYSICAL EXAM:  VITAL SIGNS: ED Triage Vitals  Enc Vitals Group     BP 02/23/18 1452 130/79     Pulse Rate 02/23/18 1452 92     Resp 02/23/18 1452 20     Temp 02/23/18 1452 98.1 F (36.7 C)     Temp Source 02/23/18 1452 Oral     SpO2 02/23/18 1452 99 %     Weight 02/23/18 1452 160 lb (72.6 kg)     Height 02/23/18 1452 6' (1.829 m)     Head Circumference --      Peak Flow --      Pain Score 02/23/18 1500 9     Pain Loc --      Pain Edu? --  Excl. in GC? --    Constitutional: Alert and oriented. Well appearing and in no acute distress. Neck: No cervical spine tenderness to palpation. Cardiovascular: Normal rate, regular rhythm. Grossly normal heart sounds.  Good peripheral circulation. Respiratory: Normal respiratory effort.  No retractions. Lungs CTAB. Musculoskeletal: Patient sits and stands with heavy reliance on upper extremity.  Patient maintain a slightly flexed position upon standing.  Patient has decreased range of motion is all fields.  Patient has bilateral paraspinal muscle spasm with lateral movements.   Neurologic:  Normal speech and language. No gross focal neurologic  deficits are appreciated. No gait instability. Skin:  Skin is warm, dry and intact. No rash noted. Psychiatric: Mood and affect are normal. Speech and behavior are normal.  ____________________________________________   LABS (all labs ordered are listed, but only abnormal results are displayed)  Labs Reviewed - No data to display ____________________________________________  EKG   ____________________________________________  RADIOLOGY  ED MD interpretation:    Official radiology report(s): Dg Lumbar Spine 2-3 Views  Result Date: 02/23/2018 CLINICAL DATA:  Lumbago.  History of prior trauma. EXAM: LUMBAR SPINE - 2-3 VIEW COMPARISON:  Lumbar CT September 12, 2016 FINDINGS: Frontal, lateral, and spot lumbosacral lateral images were obtained. There are 6 non-rib-bearing lumbar type vertebral bodies. Anterior wedging of the L1 vertebral body is again noted, stable. There is no new fracture. No spondylolisthesis. The disc spaces appear unremarkable. No erosive changes. IMPRESSION: Note 6 non-rib-bearing lumbar type vertebral bodies. Stable anterior wedging of the L1 vertebral body. No new fracture. No spondylolisthesis. No appreciable arthropathy. Electronically Signed   By: Bretta BangWilliam  Woodruff III M.D.   On: 02/23/2018 15:31    ____________________________________________   PROCEDURES  Procedure(s) performed: None  Procedures  Critical Care performed: No  ____________________________________________   INITIAL IMPRESSION / ASSESSMENT AND PLAN / ED COURSE  As part of my medical decision making, I reviewed the following data within the electronic MEDICAL RECORD NUMBER    Patient presents with acute back pain for 1 week.  Patient has a history of chronic back pain secondary to a compression fracture and degenerative disks of the lumbar spine.  Discussed x-ray findings with patient which shows no change from previous films.  Patient given discharge care instruction advised take medication  as directed.  Patient advised to follow-up with community health center for continued care.      ____________________________________________   FINAL CLINICAL IMPRESSION(S) / ED DIAGNOSES  Final diagnoses:  Chronic midline low back pain with bilateral sciatica     ED Discharge Orders         Ordered    oxyCODONE-acetaminophen (PERCOCET) 5-325 MG tablet  Every 6 hours PRN     02/23/18 1514    cyclobenzaprine (FLEXERIL) 10 MG tablet  3 times daily PRN     02/23/18 1514    methylPREDNISolone (MEDROL DOSEPAK) 4 MG TBPK tablet     02/23/18 1514           Note:  This document was prepared using Dragon voice recognition software and may include unintentional dictation errors.    Joni ReiningSmith, Ronald K, PA-C 02/23/18 1543    Minna AntisPaduchowski, Kevin, MD 02/24/18 2154

## 2018-03-13 ENCOUNTER — Other Ambulatory Visit: Payer: Self-pay

## 2018-03-13 ENCOUNTER — Emergency Department
Admission: EM | Admit: 2018-03-13 | Discharge: 2018-03-14 | Disposition: A | Discharge status: Home / Self Care | Payer: Self-pay | Attending: Emergency Medicine | Admitting: Emergency Medicine

## 2018-03-13 DIAGNOSIS — F17210 Nicotine dependence, cigarettes, uncomplicated: Secondary | ICD-10-CM | POA: Insufficient documentation

## 2018-03-13 DIAGNOSIS — M545 Low back pain, unspecified: Secondary | ICD-10-CM

## 2018-03-13 DIAGNOSIS — Z79899 Other long term (current) drug therapy: Secondary | ICD-10-CM | POA: Insufficient documentation

## 2018-03-13 DIAGNOSIS — G8929 Other chronic pain: Secondary | ICD-10-CM | POA: Insufficient documentation

## 2018-03-13 MED ORDER — ORPHENADRINE CITRATE 30 MG/ML IJ SOLN
60.0000 mg | Freq: Once | INTRAMUSCULAR | Status: AC
  Administered 2018-03-13: 60 mg via INTRAMUSCULAR
  Filled 2018-03-13: qty 2

## 2018-03-13 MED ORDER — OXYCODONE-ACETAMINOPHEN 5-325 MG PO TABS
1.0000 | ORAL_TABLET | Freq: Once | ORAL | Status: AC
  Administered 2018-03-13: 1 via ORAL
  Filled 2018-03-13: qty 1

## 2018-03-13 MED ORDER — KETOROLAC TROMETHAMINE 30 MG/ML IJ SOLN
30.0000 mg | Freq: Once | INTRAMUSCULAR | Status: AC
  Administered 2018-03-13: 30 mg via INTRAMUSCULAR
  Filled 2018-03-13: qty 1

## 2018-03-13 NOTE — ED Provider Notes (Signed)
[facility] Emergency Department Provider Note  ____________________________________________  Time seen: Approximately 10:46 PM  I have reviewed the triage vital signs and the nursing notes.   HISTORY  Chief Complaint Back Pain    HPI Dwayne Adams is a 38 y.o. male who presents the emergency department complaining of lower back pain.  Patient has a history of chronic lower back pain from a compression fracture of L1.  Patient reports that this is a remote history but he typically has flares.  Patient reports that tonight he was assisting his wife to move a piece of furniture when he felt a popping sensation in his lower back.  Patient has had a significant increase in his lower back pain.  Patient denies any history of sciatica and currently denies any radicular pain.  No bowel or bladder dysfunction, saddle anesthesia or paresthesias.  Patient did have a flare several weeks ago with a reassuring x-ray.  Patient does not take any medications tonight for symptoms prior to arrival.  Patient denies any urinary or GI symptoms.  No other complaints at this time.    Past Medical History:  Diagnosis Date  . Laceration of hand 02/2017   R hand     There are no active problems to display for this patient.   Past Surgical History:  Procedure Laterality Date  . HAND SURGERY Right     Prior to Admission medications   Medication Sig Start Date End Date Taking? Authorizing Provider  cyclobenzaprine (FLEXERIL) 10 MG tablet Take 1 tablet (10 mg total) by mouth 3 (three) times daily as needed. 02/23/18   Joni Reining, PA-C  cyclobenzaprine (FLEXERIL) 5 MG tablet Take 1 tablet (5 mg total) by mouth at bedtime. 09/12/16   Menshew, Charlesetta Ivory, PA-C  meloxicam (MOBIC) 15 MG tablet Take 1 tablet (15 mg total) by mouth daily. 03/14/18   Zana Biancardi, Delorise Royals, PA-C  methocarbamol (ROBAXIN) 500 MG tablet Take 1 tablet (500 mg total) by mouth 4 (four) times daily. 03/14/18    Finis Hendricksen, Delorise Royals, PA-C  methylPREDNISolone (MEDROL DOSEPAK) 4 MG TBPK tablet Take Tapered dose as directed 02/23/18   Joni Reining, PA-C  oxyCODONE-acetaminophen (PERCOCET/ROXICET) 5-325 MG tablet Take 1 tablet by mouth every 6 (six) hours as needed for severe pain. 03/14/18   Velna Hedgecock, Delorise Royals, PA-C    Allergies Patient has no known allergies.  No family history on file.  Social History Social History   Tobacco Use  . Smoking status: Current Every Day Smoker    Types: E-cigarettes  . Smokeless tobacco: Never Used  Substance Use Topics  . Alcohol use: No  . Drug use: No     Review of Systems  Constitutional: No fever/chills Eyes: No visual changes. Cardiovascular: no chest pain. Respiratory: no cough. No SOB. Gastrointestinal: No abdominal pain.  No nausea, no vomiting.  Musculoskeletal: Positive for lower back pain with no radicular symptoms. Skin: Negative for rash, abrasions, lacerations, ecchymosis. Neurological: Negative for headaches, focal weakness or numbness. 10-point ROS otherwise negative.  ____________________________________________   PHYSICAL EXAM:  VITAL SIGNS: ED Triage Vitals [03/13/18 1944]  Enc Vitals Group     BP (!) 133/92     Pulse Rate (!) 105     Resp 18     Temp 97.8 F (36.6 C)     Temp Source Oral     SpO2 100 %     Weight      Height      Head  Circumference      Peak Flow      Pain Score 8     Pain Loc      Pain Edu?      Excl. in GC?      Constitutional: Alert and oriented. Well appearing and in no acute distress. Eyes: Conjunctivae are normal. PERRL. EOMI. Head: Atraumatic. Neck: No stridor.  No cervical spine tenderness to palpation.  Cardiovascular: Normal rate, regular rhythm. Normal S1 and S2.  Good peripheral circulation. Respiratory: Normal respiratory effort without tachypnea or retractions. Lungs CTAB. Good air entry to the bases with no decreased or absent breath sounds. Gastrointestinal: Bowel sounds 4  quadrants. Soft and nontender to palpation. No guarding or rigidity. No palpable masses. No distention. No CVA tenderness. Musculoskeletal: Full range of motion to all extremities. No gross deformities appreciated.  No visible abnormality to the lumbar spine.  Patient does have some tenderness to palpation over the left paraspinal muscle group in the range of T10-L3.  Patient also has tenderness to palpation over paraspinal muscle group in the right L2-L5 range.  Minimal midline tenderness.  No palpable abnormality or step-off.  No tenderness to palpation over bilateral sciatic notches.  Negative straight leg raise bilaterally.  Dorsalis pedis pulse intact bilateral lower extremities.  Sensation intact and equal bilateral lower extremities. Neurologic:  Normal speech and language. No gross focal neurologic deficits are appreciated.  Skin:  Skin is warm, dry and intact. No rash noted. Psychiatric: Mood and affect are normal. Speech and behavior are normal. Patient exhibits appropriate insight and judgement.   ____________________________________________   LABS (all labs ordered are listed, but only abnormal results are displayed)  Labs Reviewed - No data to display ____________________________________________  EKG   ____________________________________________  RADIOLOGY   No results found.  ____________________________________________    PROCEDURES  Procedure(s) performed:    Procedures    Medications  ketorolac (TORADOL) 30 MG/ML injection 30 mg (30 mg Intramuscular Given 03/13/18 2326)  orphenadrine (NORFLEX) injection 60 mg (60 mg Intramuscular Given 03/13/18 2326)  oxyCODONE-acetaminophen (PERCOCET/ROXICET) 5-325 MG per tablet 1 tablet (1 tablet Oral Given 03/13/18 2326)     ____________________________________________   INITIAL IMPRESSION / ASSESSMENT AND PLAN / ED COURSE  Pertinent labs & imaging results that were available during my care of the patient were reviewed  by me and considered in my medical decision making (see chart for details).  Review of the [place] CSRS was performed in accordance of the [facility] prior to dispensing any controlled drugs.      Patient's diagnosis is consistent with acute on chronic lower back pain.  Patient presents the emergency department with a complaint of lower back pain after helping his wife move furniture.  No direct trauma to the spine.  Findings are consistent with strain of the region to complicate a chronic issue.  No concerning findings warranting labs or imaging at this time.  Patient will be treated symptomatically with injection of Toradol, Norflex, pain medication.  Patient will be prescribed meloxicam, Robaxin, limited prescription of Percocet.  Patient did have health insurance until it accidentally lapsed.  Patient is in the 45-day window awaiting reinstatement of his health insurance.  When patient is reinstated with his health insurance he will follow-up with neurosurgery given his chronic back pain..  Patient is given ED precautions to return to the ED for any worsening or new symptoms.     ____________________________________________  FINAL CLINICAL IMPRESSION(S) / ED DIAGNOSES  Final diagnoses:  Chronic midline low  back pain without sciatica      NEW MEDICATIONS STARTED DURING THIS VISIT:  ED Discharge Orders         Ordered    meloxicam (MOBIC) 15 MG tablet  Daily     03/14/18 0009    methocarbamol (ROBAXIN) 500 MG tablet  4 times daily     03/14/18 0009    oxyCODONE-acetaminophen (PERCOCET/ROXICET) 5-325 MG tablet  Every 6 hours PRN     03/14/18 0009              This chart was dictated using voice recognition software/Dragon. Despite best efforts to proofread, errors can occur which can change the meaning. Any change was purely unintentional.    Racheal PatchesCuthriell, Sharryn Belding D, PA-C 03/14/18 0038    Emily FilbertWilliams, Tyeasha Ebbs E, MD 03/16/18 [phone]

## 2018-03-13 NOTE — ED Triage Notes (Signed)
Patient reports chronic back pain from old compression fracture reports usually ibuprofen helps.  Reports over past 2 weeks pain has been steady and unrelieved by ibuprofen.  Reports moved a couch today and felt a pop.

## 2018-03-13 NOTE — ED Notes (Signed)
Reports non traumatic lower back pain and spasms. Patient reports pain x 3 days.

## 2018-03-14 MED ORDER — MELOXICAM 15 MG PO TABS: 15.0000 mg | ORAL_TABLET | Freq: Every day | ORAL | 0 refills | Status: DC

## 2018-03-14 MED ORDER — METHOCARBAMOL 500 MG PO TABS: 500.0000 mg | ORAL_TABLET | Freq: Four times a day (QID) | ORAL | 0 refills | Status: DC

## 2018-03-14 MED ORDER — OXYCODONE-ACETAMINOPHEN 5-325 MG PO TABS: 1.0000 | ORAL_TABLET | Freq: Four times a day (QID) | ORAL | 0 refills | Status: DC | PRN

## 2018-05-16 ENCOUNTER — Other Ambulatory Visit: Payer: Self-pay

## 2018-05-16 ENCOUNTER — Emergency Department
Admission: EM | Admit: 2018-05-16 | Discharge: 2018-05-16 | Disposition: A | Discharge status: Home / Self Care | Payer: Self-pay | Attending: Emergency Medicine | Admitting: Emergency Medicine

## 2018-05-16 ENCOUNTER — Encounter: Payer: Self-pay | Admitting: Intensive Care

## 2018-05-16 DIAGNOSIS — F17210 Nicotine dependence, cigarettes, uncomplicated: Secondary | ICD-10-CM | POA: Insufficient documentation

## 2018-05-16 DIAGNOSIS — G8929 Other chronic pain: Secondary | ICD-10-CM | POA: Insufficient documentation

## 2018-05-16 DIAGNOSIS — M545 Low back pain, unspecified: Secondary | ICD-10-CM

## 2018-05-16 DIAGNOSIS — F17290 Nicotine dependence, other tobacco product, uncomplicated: Secondary | ICD-10-CM | POA: Insufficient documentation

## 2018-05-16 MED ORDER — METHOCARBAMOL 500 MG PO TABS: 500.0000 mg | ORAL_TABLET | Freq: Four times a day (QID) | ORAL | 0 refills | Status: DC | PRN

## 2018-05-16 MED ORDER — ETODOLAC 400 MG PO TABS: 400.0000 mg | ORAL_TABLET | Freq: Two times a day (BID) | ORAL | 0 refills | Status: DC

## 2018-05-16 MED ORDER — ACETAMINOPHEN-CODEINE 300-60 MG PO TABS: 1.0000 | ORAL_TABLET | Freq: Four times a day (QID) | ORAL | 0 refills | Status: DC | PRN

## 2018-05-16 MED ORDER — ACETAMINOPHEN-CODEINE #3 300-30 MG PO TABS: 1.0000 | ORAL_TABLET | Freq: Four times a day (QID) | ORAL | 0 refills | Status: DC | PRN

## 2018-05-16 NOTE — ED Notes (Signed)
See triage note  Presents with pain to mid to lower back  States pain started about 3-4 days ago  Unsure of specific injury but has done a lot of lifting and moving boxes    States pain is moving up back into neck  Ambulates slowly d/t pain

## 2018-05-16 NOTE — ED Triage Notes (Signed)
Patient reports his back started hurting X2-3 days ago and has progressively gotten worse. Reports difficulty getting up and sitting and moving around. HX back problems

## 2018-05-16 NOTE — ED Provider Notes (Signed)
[facility] Emergency Department Provider Note   ____________________________________________   First MD Initiated Contact with Patient 05/16/18 1422     (approximate)  I have reviewed the triage vital signs and the nursing notes.   HISTORY  Chief Complaint Back Pain (lower)   HPI Dwayne Adams is a 38 y.o. male presents to the ED with complaint of mid to low back pain.  Patient states that he has had recurrent chronic back pain and has not seen a orthopedist and denies having a PCP at this time.  Patient denies any new injury to his back.  He states that this most recent pain began 2 to 3 days ago and has slowly gotten worse.  He denies any saddle anesthesias or incontinence of bowel or bladder.  He continues to ambulate without any assistance.  Currently rates his pain as an 8 out of 10.      Past Medical History:  Diagnosis Date  . Laceration of hand 02/2017   R hand     There are no active problems to display for this patient.   Past Surgical History:  Procedure Laterality Date  . HAND SURGERY Right     Prior to Admission medications   Medication Sig Start Date End Date Taking? Authorizing Provider  acetaminophen-codeine (TYLENOL #4) 300-60 MG tablet Take 1 tablet by mouth every 6 (six) hours as needed for pain. 05/16/18   Tommi Rumps, PA-C  etodolac (LODINE) 400 MG tablet Take 1 tablet (400 mg total) by mouth 2 (two) times daily. 05/16/18   Tommi Rumps, PA-C  methocarbamol (ROBAXIN) 500 MG tablet Take 1 tablet (500 mg total) by mouth every 6 (six) hours as needed. 05/16/18   Tommi Rumps, PA-C    Allergies Patient has no known allergies.  History reviewed. No pertinent family history.  Social History Social History   Tobacco Use  . Smoking status: Current Every Day Smoker    Types: E-cigarettes, Cigarettes  . Smokeless tobacco: Never Used  Substance Use Topics  . Alcohol use: No  . Drug use: No    Review of Systems  Constitutional: No fever/chills Cardiovascular: Denies chest pain. Respiratory: Denies shortness of breath. Gastrointestinal: No abdominal pain.  No nausea, no vomiting. Genitourinary: Negative for dysuria. Musculoskeletal: Positive for chronic back pain. Skin: Negative for rash. ___________________________________________   PHYSICAL EXAM:  VITAL SIGNS: ED Triage Vitals [05/16/18 1412]  Enc Vitals Group     BP (!) 138/112     Pulse Rate 97     Resp 18     Temp 98.1 F (36.7 C)     Temp Source Oral     SpO2 99 %     Weight 170 lb (77.1 kg)     Height 6' (1.829 m)     Head Circumference      Peak Flow      Pain Score 8     Pain Loc      Pain Edu?      Excl. in GC?     Constitutional: Alert and oriented. Well appearing and in no acute distress. Eyes: Conjunctivae are normal. PERRL. EOMI. Head: Atraumatic. Neck: No stridor.   Cardiovascular: Normal rate, regular rhythm. Grossly normal heart sounds.  Good peripheral circulation. Respiratory: Normal respiratory effort.  No retractions. Lungs CTAB. Gastrointestinal: Soft and nontender. No distention.  Musculoskeletal: On examination of the back there is no gross deformity however there is generalized tenderness on palpation of the lower thoracic  and lumbar area.  There is no step-offs or point tenderness on palpation of the vertebral bodies.  There is tenderness on palpation of the bilateral paravertebral muscles.  No active muscle spasms were noted.  Right leg raises were negative.  Good muscle strength bilaterally. Neurologic:  Normal speech and language. No gross focal neurologic deficits are appreciated. Reflexes 2+ bilaterally.   No gait instability. Skin:  Skin is warm, dry and intact.  Psychiatric: Mood and affect are normal. Speech and behavior are normal.  ____________________________________________   LABS (all labs ordered are listed, but only abnormal results are displayed)  Labs Reviewed - No data to display    PROCEDURES  Procedure(s) performed (including Critical Care):  Procedures   ____________________________________________   INITIAL IMPRESSION / ASSESSMENT AND PLAN / ED COURSE  Patient is encouraged to make or keep the doctor's appointment that his wife may have already made for him.  We discussed his chronic back pain and need for ice or heat to his lower back.  Patient was given a prescription for methocarbamol 500 mg every 6 hours as needed for muscle spasms and Tylenol No. 4 as needed for pain.  He is aware that both medications could cause drowsiness and increase his risk for injury.  He was also given a prescription for etodolac 400 mg twice daily with food.  ____________________________________________   FINAL CLINICAL IMPRESSION(S) / ED DIAGNOSES  Final diagnoses:  Acute exacerbation of chronic low back pain     ED Discharge Orders         Ordered    methocarbamol (ROBAXIN) 500 MG tablet  Every 6 hours PRN     05/16/18 1456    acetaminophen-codeine (TYLENOL #4) 300-60 MG tablet  Every 6 hours PRN     05/16/18 1456    etodolac (LODINE) 400 MG tablet  2 times daily,   Status:  Discontinued     05/16/18 1456    etodolac (LODINE) 400 MG tablet  2 times daily     05/16/18 1501           Note:  This document was prepared using Dragon voice recognition software and may include unintentional dictation errors.    Tommi Rumps, PA-C 05/16/18 1612    Emily Filbert, MD 05/17/18 [phone]

## 2018-05-16 NOTE — Discharge Instructions (Addendum)
Make appointment or if your wife is already made the appointment keep the appointment with your doctor about your chronic back pain.  You may also use ice or heat to your back as needed for discomfort.  Medications were sent to your pharmacy.  Be aware that the methocarbamol could cause drowsiness and increase your risk for injury however it will help with muscle spasms.  Also the acetaminophen with codeine could cause drowsiness as well.  The etodolac 400 mg twice daily with food every day for inflammation and chronic pain.

## 2018-05-16 NOTE — ED Notes (Signed)
Pt verbalized understanding of d/c instructions, RX, and f/u care. No further questions at this time. Pt ambulatory to the exit with steady gait  

## 2018-05-18 ENCOUNTER — Encounter: Payer: Self-pay | Admitting: Emergency Medicine

## 2018-05-18 ENCOUNTER — Emergency Department
Admission: EM | Admit: 2018-05-18 | Discharge: 2018-05-18 | Disposition: A | Discharge status: Home / Self Care | Payer: Self-pay | Attending: Emergency Medicine | Admitting: Emergency Medicine

## 2018-05-18 ENCOUNTER — Other Ambulatory Visit: Payer: Self-pay

## 2018-05-18 DIAGNOSIS — G8929 Other chronic pain: Secondary | ICD-10-CM | POA: Insufficient documentation

## 2018-05-18 DIAGNOSIS — F17210 Nicotine dependence, cigarettes, uncomplicated: Secondary | ICD-10-CM | POA: Insufficient documentation

## 2018-05-18 DIAGNOSIS — M545 Low back pain, unspecified: Secondary | ICD-10-CM

## 2018-05-18 DIAGNOSIS — M62830 Muscle spasm of back: Secondary | ICD-10-CM | POA: Insufficient documentation

## 2018-05-18 MED ORDER — HYDROCODONE-ACETAMINOPHEN 5-325 MG PO TABS: 1.0000 | ORAL_TABLET | Freq: Three times a day (TID) | ORAL | 0 refills | Status: AC | PRN

## 2018-05-18 MED ORDER — PREDNISONE 20 MG PO TABS: 20.0000 mg | ORAL_TABLET | Freq: Two times a day (BID) | ORAL | 0 refills | Status: AC

## 2018-05-18 MED ORDER — MELOXICAM 15 MG PO TABS: 15.0000 mg | ORAL_TABLET | Freq: Every day | ORAL | 1 refills | Status: AC

## 2018-05-18 MED ORDER — ORPHENADRINE CITRATE 30 MG/ML IJ SOLN
60.0000 mg | INTRAMUSCULAR | Status: AC
  Administered 2018-05-18: 60 mg via INTRAMUSCULAR
  Filled 2018-05-18: qty 2

## 2018-05-18 MED ORDER — KETOROLAC TROMETHAMINE 30 MG/ML IJ SOLN
30.0000 mg | Freq: Once | INTRAMUSCULAR | Status: AC
  Administered 2018-05-18: 30 mg via INTRAMUSCULAR
  Filled 2018-05-18: qty 1

## 2018-05-18 NOTE — ED Triage Notes (Signed)
Here for lower back pain.  Has chronic low back pain and has been seen 4 times this year for same. Has not FU at his doctor. Reports pain is not better.  Ambulatory without difficulty. No loss bowel or bladder.  Worse with movement.

## 2018-05-18 NOTE — Discharge Instructions (Addendum)
Your exam is consistent with acute thoracic muscle strain and chronic low back pain. Take the prescription meds as directed. Follow-up with your provider for ongoing management.

## 2018-05-18 NOTE — ED Provider Notes (Signed)
[facility] Emergency Department Provider Note ____________________________________________  Time seen: [id number]  I have reviewed the triage vital signs and the nursing notes.  HISTORY  Chief Complaint  Back Pain  HPI Dwayne Adams is a 38 y.o. male resents to the ED for evaluation of acute on chronic back pain.  Patient describes his chronic low back pain which is midline and without referral.  He reports that since the flare of this back pain last week, he has had increasing stiffness to the neck and right scapulothoracic region.  He would describe his symptoms in the neck or due to his attempts to keep his back straight, and this is caused him increased muscle spasm.  He denies any recent injury, accident, or trauma.  Patient was seen in the ED 2 days prior for the same complaint.  He describes the onset was while attempting to help a neighbor with some physical labor.  Denies any outright falls, nausea, vomiting, chest pain, or dizziness.  Also denies any distal paresthesias, foot drop, incontinence, or saddle anesthesia.  Patient has recently had his insurance reinstated, but is not been able to secure an appointment with his primary provider.  He denies any history of back surgery, but gives a remote history of a lumbar compression fracture from 2008.  He has not had any recent interventions for his ongoing back pain.  He is not on any current long-term prescription medications.  He was treated most recently with methocarbamol and Tylenol 3.  He denies any significant benefit with these medications.  He also reports that he did not fill prescription for the etodolac, and has does any over-the-counter ibuprofen, for concerns that it would potentially mask symptoms of COVID-19.  Past Medical History:  Diagnosis Date  . Laceration of hand 02/2017   R hand     There are no active problems to display for this patient.   Past Surgical History:  Procedure Laterality Date  .  HAND SURGERY Right     Prior to Admission medications   Medication Sig Start Date End Date Taking? Authorizing Provider  acetaminophen-codeine (TYLENOL #3) 300-30 MG tablet Take 1 tablet by mouth every 6 (six) hours as needed for moderate pain. 05/16/18   Tommi RumpsSummers, Rhonda L, PA-C  etodolac (LODINE) 400 MG tablet Take 1 tablet (400 mg total) by mouth 2 (two) times daily. 05/16/18   Tommi RumpsSummers, Rhonda L, PA-C  HYDROcodone-acetaminophen (NORCO) 5-325 MG tablet Take 1 tablet by mouth 3 (three) times daily as needed for up to 3 days. 05/18/18 05/21/18  Bernardette Waldron, Charlesetta IvoryJenise V Bacon, PA-C  meloxicam (MOBIC) 15 MG tablet Take 1 tablet (15 mg total) by mouth daily. 05/18/18 07/17/18  Keonna Raether, Charlesetta IvoryJenise V Bacon, PA-C  methocarbamol (ROBAXIN) 500 MG tablet Take 1 tablet (500 mg total) by mouth every 6 (six) hours as needed. 05/16/18   Tommi RumpsSummers, Rhonda L, PA-C  predniSONE (DELTASONE) 20 MG tablet Take 1 tablet (20 mg total) by mouth 2 (two) times daily with a meal for 5 days. 05/18/18 05/23/18  Morrell Fluke, Charlesetta IvoryJenise V Bacon, PA-C    Allergies Patient has no known allergies.  History reviewed. No pertinent family history.  Social History Social History   Tobacco Use  . Smoking status: Current Every Day Smoker    Types: E-cigarettes, Cigarettes  . Smokeless tobacco: Never Used  Substance Use Topics  . Alcohol use: No  . Drug use: No    Review of Systems  Constitutional: Negative for fever. Eyes: Negative for visual changes.  ENT: Negative for sore throat. Cardiovascular: Negative for chest pain. Respiratory: Negative for shortness of breath. Gastrointestinal: Negative for abdominal pain, vomiting and diarrhea. Genitourinary: Negative for dysuria. Musculoskeletal: Positive for back pain. Skin: Negative for rash. Neurological: Negative for headaches, focal weakness or numbness. ____________________________________________  PHYSICAL EXAM:  VITAL SIGNS: ED Triage Vitals  Enc Vitals Group     BP 05/18/18 1835 (!) 131/98      Pulse Rate 05/18/18 1835 93     Resp 05/18/18 2009 16     Temp 05/18/18 1835 97.8 F (36.6 C)     Temp Source 05/18/18 1835 Oral     SpO2 05/18/18 1835 98 %     Weight 05/18/18 1833 170 lb (77.1 kg)     Height 05/18/18 1833 6' (1.829 m)     Head Circumference --      Peak Flow --      Pain Score 05/18/18 1832 9     Pain Loc --      Pain Edu? --      Excl. in GC? --     Constitutional: Alert and oriented. Well appearing and in no distress. Head: Normocephalic and atraumatic. Eyes: Conjunctivae are normal. Normal extraocular movements Neck: Supple.  No distracting midline tenderness is appreciated.  Normal range of motion on exam without crepitus.  Patient is mildly tender to palpation to the right upper trapezius musculature. Cardiovascular: Normal rate, regular rhythm. Normal distal pulses. Respiratory: Normal respiratory effort. No wheezes/rales/rhonchi. Gastrointestinal: Soft and nontender. No distention.  No CVA tenderness elicited. Musculoskeletal: Normal spinal alignment without significant midline tenderness, spasm, deformity, or step-off.  Patient localizes pain to the low back to the low lumbar region in the midline.  No sacral tailbone tenderness is appreciated.  Nontender with normal range of motion in all extremities.  Neurologic: Cranial nerves II through XII grossly intact.  Normal LE DTRs bilaterally.  Negative seated straight leg raise bilaterally.  Normal gait without ataxia. Normal speech and language. No gross focal neurologic deficits are appreciated. Skin:  Skin is warm, dry and intact. No rash noted. Psychiatric: Mood and affect are normal. Patient exhibits appropriate insight and judgment. ____________________________________________   RADIOLOGY  Previous imaging reviewed.  ____________________________________________  PROCEDURES  Procedures Ketorolac 30 mg IM Norflex 60 mg IM ____________________________________________  INITIAL IMPRESSION /  ASSESSMENT AND PLAN / ED COURSE  Patient with ED evaluation of ongoing acute on chronic low back pain as well as some scapulothoracic muscle spasm.  Patient's exam is overall reassuring and benign at this time.  Symptoms likely represent acute muscle strain of the scapulothoracic region.  He also has some chronic midline low back pain of an unclear etiology at this time.  Patient's exam without any acute neuromuscular deficit.  He is discharged with some improvement subjectively reported after ED medication administration.  Prescriptions for prednisone to dose over the next 5 days of been provided.  Following that he will be on a course of meloxicam once daily for long-term anti-inflammatory benefit.  A small prescription of hydrocodone (#9) is provided patient to take sparingly, as discussed.  He is encouraged to consider chiropractic care for modalities at this time, and to consider follow-up with the ortho-spine specialist or neurologist at [facility].  A work note is provided for 2 days as requested.  Return precautions have been reviewed.  I reviewed the patient's prescription history over the last 12 months in the multi-state controlled substances database(s) that includes [place], [place], [place], [place], [place], [place],  [place], [place], [place], [place], [place], [place], [place], and [place].  Results were notable for recent prescriptions as noted ____________________________________________  FINAL CLINICAL IMPRESSION(S) / ED DIAGNOSES  Final diagnoses:  Chronic midline low back pain without sciatica  Spasm of thoracic back muscle      Janani Chamber, Charlesetta Ivory, PA-C 05/18/18 2109    Minna Antis, MD 05/18/18 2240

## 2019-02-24 ENCOUNTER — Other Ambulatory Visit: Payer: Self-pay

## 2019-02-24 ENCOUNTER — Emergency Department: Payer: HRSA Program

## 2019-02-24 ENCOUNTER — Emergency Department
Admission: EM | Admit: 2019-02-24 | Discharge: 2019-02-24 | Disposition: A | Discharge status: Home / Self Care | Payer: HRSA Program | Attending: Emergency Medicine | Admitting: Emergency Medicine

## 2019-02-24 ENCOUNTER — Encounter: Payer: Self-pay | Admitting: Emergency Medicine

## 2019-02-24 DIAGNOSIS — Z87891 Personal history of nicotine dependence: Secondary | ICD-10-CM | POA: Diagnosis not present

## 2019-02-24 DIAGNOSIS — R0602 Shortness of breath: Secondary | ICD-10-CM | POA: Insufficient documentation

## 2019-02-24 DIAGNOSIS — Z20822 Contact with and (suspected) exposure to covid-19: Secondary | ICD-10-CM | POA: Insufficient documentation

## 2019-02-24 DIAGNOSIS — R519 Headache, unspecified: Secondary | ICD-10-CM | POA: Insufficient documentation

## 2019-02-24 DIAGNOSIS — M791 Myalgia, unspecified site: Secondary | ICD-10-CM | POA: Diagnosis not present

## 2019-02-24 DIAGNOSIS — R05 Cough: Secondary | ICD-10-CM | POA: Insufficient documentation

## 2019-02-24 MED ORDER — AZITHROMYCIN 250 MG PO TABS: ORAL_TABLET | ORAL | 0 refills | Status: AC

## 2019-02-24 MED ORDER — PREDNISONE 10 MG (21) PO TBPK: ORAL_TABLET | ORAL | 0 refills | Status: AC

## 2019-02-24 MED ORDER — IVERMECTIN 3 MG PO TABS: 150.0000 ug/kg | ORAL_TABLET | Freq: Once | ORAL | 1 refills | Status: AC

## 2019-02-24 NOTE — ED Provider Notes (Signed)
[facility] Emergency Department Provider Note  ____________________________________________   First MD Initiated Contact with Patient 02/24/19 1228     (approximate)  I have reviewed the triage vital signs and the nursing notes.   HISTORY  Chief Complaint Cough and Generalized Body Aches    HPI Dwayne Adams is a 39 y.o. male presents emergency department Covid-like symptoms.  Patient's had a headache, cough, some shortness of breath, muscle aches since Sunday.  He denies any known fever.  States that he still has a sense of taste and smell.  He was exposed to someone that had just quarantined for Covid.  He denies any vomiting or diarrhea.    Past Medical History:  Diagnosis Date  . Laceration of hand 02/2017   R hand     There are no problems to display for this patient.   Past Surgical History:  Procedure Laterality Date  . HAND SURGERY Right     Prior to Admission medications   Medication Sig Start Date End Date Taking? Authorizing Provider  azithromycin (ZITHROMAX Z-PAK) 250 MG tablet 2 pills today then 1 pill a day for 4 days 02/24/19   Sherrie Mustache Roselyn Bering, PA-C  ivermectin (STROMECTOL) 3 MG TABS tablet Take 4 tablets (12,000 mcg total) by mouth once for 1 dose. Repeat in 3 days 02/24/19 02/24/19  Sherrie Mustache, Roselyn Bering, PA-C  predniSONE (STERAPRED UNI-PAK 21 TAB) 10 MG (21) TBPK tablet Take 6 pills on day one then decrease by 1 pill each day 02/24/19   Faythe Ghee, PA-C    Allergies Patient has no known allergies.  History reviewed. No pertinent family history.  Social History Social History   Tobacco Use  . Smoking status: Former Smoker    Types: E-cigarettes, Cigarettes  . Smokeless tobacco: Never Used  Substance Use Topics  . Alcohol use: No  . Drug use: No    Review of Systems  Constitutional: No fever/chills, positive body aches Eyes: No visual changes. ENT: No sore throat. Respiratory: Positive cough, positive shortness of  breath Cardiovascular: Denies chest pain Gastrointestinal: Denies abdominal pain Genitourinary: Negative for dysuria. Musculoskeletal: Negative for back pain. Skin: Negative for rash. Psychiatric: no mood changes,     ____________________________________________   PHYSICAL EXAM:  VITAL SIGNS: ED Triage Vitals [02/24/19 1216]  Enc Vitals Group     BP (!) 144/84     Pulse Rate 92     Resp 18     Temp 98.6 F (37 C)     Temp Source Oral     SpO2 97 %     Weight 184 lb (83.5 kg)     Height 6' (1.829 m)     Head Circumference      Peak Flow      Pain Score 5     Pain Loc      Pain Edu?      Excl. in GC?     Constitutional: Alert and oriented. Well appearing and in no acute distress. Eyes: Conjunctivae are normal.  Head: Atraumatic. Nose: No congestion/rhinnorhea. Mouth/Throat: Mucous membranes are moist.   Neck:  supple no lymphadenopathy noted Cardiovascular: Normal rate, regular rhythm. Heart sounds are normal Respiratory: Normal respiratory effort.  No retractions, lungs c t a  GU: deferred Musculoskeletal: FROM all extremities, warm and well perfused Neurologic:  Normal speech and language.  Skin:  Skin is warm, dry and intact. No rash noted. Psychiatric: Mood and affect are normal. Speech and behavior are normal.  ____________________________________________   LABS (all labs ordered are listed, but only abnormal results are displayed)  Labs Reviewed  SARS CORONAVIRUS 2 (TAT 6-24 HRS)   ____________________________________________   ____________________________________________  RADIOLOGY  Chest x-ray shows apical interstitial thickening which could indicate Covid  ____________________________________________   PROCEDURES  Procedure(s) performed: No  Procedures    ____________________________________________   INITIAL IMPRESSION / ASSESSMENT AND PLAN / ED COURSE  Pertinent labs & imaging results that were available during my care of the  patient were reviewed by me and considered in my medical decision making (see chart for details).   Patient presents emergency department Covid-like symptoms.  See HPI  Physical exam patient's vitals are stable.  Is in no acute distress.  Lungs clear to all station.  Ambulatory pulse ox remains at 100% on room air.  Chest x-ray Covid test  Chest x-ray shows apical thickening which could indicate Covid.  Explained findings to the patient.  Is given a prescription for Zithromax, Sterapred, and ivermectin.  Explained to him that the ivermectin is an off label use which has been used in several countries with success.  He states he understands.  He was discharged stable condition.   [name] was evaluated in Emergency Department on 02/24/2019 for the symptoms described in the history of present illness. He was evaluated in the context of the global COVID-19 pandemic, which necessitated consideration that the patient might be at risk for infection with the SARS-CoV-2 virus that causes COVID-19. Institutional protocols and algorithms that pertain to the evaluation of patients at risk for COVID-19 are in a state of rapid change based on information released by regulatory bodies including the CDC and federal and state organizations. These policies and algorithms were followed during the patient's care in the ED.   As part of my medical decision making, I reviewed the following data within the [facility] notes reviewed and incorporated, Old chart reviewed, Radiograph reviewed see above, Notes from prior ED visits and [place] Controlled Substance Database  ____________________________________________   FINAL CLINICAL IMPRESSION(S) / ED DIAGNOSES  Final diagnoses:  Suspected COVID-19 virus infection      NEW MEDICATIONS STARTED DURING THIS VISIT:  New Prescriptions   AZITHROMYCIN (ZITHROMAX Z-PAK) 250 MG TABLET    2 pills today then 1 pill a day for 4 days   IVERMECTIN  (STROMECTOL) 3 MG TABS TABLET    Take 4 tablets (12,000 mcg total) by mouth once for 1 dose. Repeat in 3 days   PREDNISONE (STERAPRED UNI-PAK 21 TAB) 10 MG (21) TBPK TABLET    Take 6 pills on day one then decrease by 1 pill each day     Note:  This document was prepared using Dragon voice recognition software and may include unintentional dictation errors.    Versie Starks, PA-C 02/24/19 1402    Arta Silence, MD 02/24/19 1420

## 2019-02-24 NOTE — ED Notes (Signed)
Pt ambulated around the entire flex area and maintained his pulse ox at 100%

## 2019-02-24 NOTE — ED Notes (Signed)
Body aches, occasional cough and "not feeling right". In nad.

## 2019-02-24 NOTE — ED Triage Notes (Signed)
Pt presnets to ED via POV, states since Monday has had cough, generalized body aches, and cough. Pt states was exposed to friends who were cleared of Covid quarantine approx 2 days after quarantine ended. Pt A&O x 4, NAD noted at this time, VSS, RR even and unlabored.

## 2019-02-24 NOTE — Discharge Instructions (Signed)
Follow-up with your regular doctor if not improving in 3 to 5 days.  Return emergency department if worsening.  You have been given Zithromax and Sterapred to help prevent pneumonia and inflammation along his.  An off label use of ivermectin to decrease inflammation in your lungs.  Take over-the-counter Mucinex this will also help clear out the mucus in your lungs.  You should quarantine for 10 days.

## 2019-02-25 LAB — SARS CORONAVIRUS 2 (TAT 6-24 HRS): SARS Coronavirus 2: NEGATIVE

## 2019-12-10 ENCOUNTER — Other Ambulatory Visit: Payer: Self-pay

## 2019-12-10 ENCOUNTER — Ambulatory Visit: Payer: Medicaid Other | Attending: Internal Medicine

## 2019-12-10 DIAGNOSIS — Z23 Encounter for immunization: Secondary | ICD-10-CM

## 2019-12-10 NOTE — Progress Notes (Signed)
   Covid-19 Vaccination Clinic  Name:  Dwayne Adams    MRN: [id number] DOB: Aug 20, 1980  12/10/2019  [name] was observed post Covid-19 immunization for 15 minutes without incident. He was provided with Vaccine Information Sheet and instruction to access the V-Safe system.   [name] was instructed to call 911 with any severe reactions post vaccine: Marland Kitchen Difficulty breathing  . Swelling of face and throat  . A fast heartbeat  . A bad rash all over body  . Dizziness and weakness   Immunizations Administered    Name Date Dose VIS Date Route   JANSSEN COVID-19 VACCINE 12/10/2019  2:55 PM 0.5 mL 11/30/2019 Intramuscular   Manufacturer: [employer]   Lot: [id number]   NDC: [phone]

## 2020-05-05 ENCOUNTER — Other Ambulatory Visit: Payer: Self-pay

## 2020-05-05 ENCOUNTER — Emergency Department
Admission: EM | Admit: 2020-05-05 | Discharge: 2020-05-06 | Disposition: A | Discharge status: Home / Self Care | Payer: Medicaid Other | Attending: Emergency Medicine | Admitting: Emergency Medicine

## 2020-05-05 DIAGNOSIS — S01111A Laceration without foreign body of right eyelid and periocular area, initial encounter: Secondary | ICD-10-CM | POA: Insufficient documentation

## 2020-05-05 DIAGNOSIS — S01151A Open bite of right eyelid and periocular area, initial encounter: Secondary | ICD-10-CM | POA: Insufficient documentation

## 2020-05-05 DIAGNOSIS — Z87891 Personal history of nicotine dependence: Secondary | ICD-10-CM | POA: Insufficient documentation

## 2020-05-05 DIAGNOSIS — W540XXA Bitten by dog, initial encounter: Secondary | ICD-10-CM | POA: Insufficient documentation

## 2020-05-05 MED ORDER — CEPHALEXIN 500 MG PO CAPS
500.0000 mg | ORAL_CAPSULE | Freq: Once | ORAL | Status: AC
  Administered 2020-05-06: 500 mg via ORAL
  Filled 2020-05-05: qty 1

## 2020-05-05 NOTE — ED Triage Notes (Signed)
Pt's dog bit top of pt's eyelid. Did not go through. Bleeding controlled at this time. Vision not affected.

## 2020-05-06 MED ORDER — CEPHALEXIN 500 MG PO CAPS: 500.0000 mg | ORAL_CAPSULE | Freq: Four times a day (QID) | ORAL | 0 refills | Status: AC

## 2020-05-06 NOTE — ED Provider Notes (Signed)
[facility] Emergency Department Provider Note  ____________________________________________   Event Date/Time   First MD Initiated Contact with Patient 05/05/20 2300     (approximate)  I have reviewed the triage vital signs and the nursing notes.   HISTORY  Chief Complaint Animal Bite    HPI Dwayne Adams is a 40 y.o. male who presents for evaluation of a laceration to his right upper eyelid.  He said he was playing with his dog and the dog hit him with a paw and scratched the top of his eyelid.  There was mild bleeding but then when he started looking at it he realized that the wound can open up fairly wide if he pulls on his eyelid.  He has no symptoms of the eyeball itself including no scratchiness, no visual changes, and no pain of the eyeball.  He does not really have pain of the eyelid, he is just worried about the laceration.  He said it was definitely from the part of the dog and not from teeth.  Onset was acute.        Past Medical History:  Diagnosis Date  . Laceration of hand 02/2017   R hand     There are no problems to display for this patient.   Past Surgical History:  Procedure Laterality Date  . HAND SURGERY Right     Prior to Admission medications   Medication Sig Start Date End Date Taking? Authorizing Provider  cephALEXin (KEFLEX) 500 MG capsule Take 1 capsule (500 mg total) by mouth 4 (four) times daily for 5 days. 05/06/20 05/11/20 Yes Loleta Rose, MD  azithromycin (ZITHROMAX Z-PAK) 250 MG tablet 2 pills today then 1 pill a day for 4 days 02/24/19   Sherrie Mustache Roselyn Bering, PA-C  predniSONE (STERAPRED UNI-PAK 21 TAB) 10 MG (21) TBPK tablet Take 6 pills on day one then decrease by 1 pill each day 02/24/19   Faythe Ghee, PA-C    Allergies Patient has no known allergies.  History reviewed. No pertinent family history.  Social History Social History   Tobacco Use  . Smoking status: Former Smoker    Types: E-cigarettes,  Cigarettes  . Smokeless tobacco: Never Used  Vaping Use  . Vaping Use: Every day  . Start date: 09/13/2014  Substance Use Topics  . Alcohol use: No  . Drug use: No    Review of Systems Constitutional: No fever/chills Eyes: No visual changes.  No pain in the right eye, no irritation or redness. Respiratory: Denies shortness of breath. Gastrointestinal: No abdominal pain.   Musculoskeletal: No extremity injuries. Integumentary: Laceration to right upper eyelid. Neurological: Negative for headaches, focal weakness or numbness.   ____________________________________________   PHYSICAL EXAM:  VITAL SIGNS: ED Triage Vitals  Enc Vitals Group     BP 05/05/20 2150 (!) 125/103     Pulse Rate 05/05/20 2150 97     Resp 05/05/20 2150 18     Temp 05/05/20 2150 97.8 F (36.6 C)     Temp Source 05/05/20 2150 Oral     SpO2 05/05/20 2150 97 %     Weight 05/05/20 2147 90.7 kg (200 lb)     Height 05/05/20 2147 1.829 m (6')     Head Circumference --      Peak Flow --      Pain Score 05/05/20 2147 1     Pain Loc --      Pain Edu? --      Excl.  in GC? --     Constitutional: Alert and oriented.  Eyes: Conjunctivae are normal.  Pupils appear normal.  Patient has a 1.3 cm linear laceration on the right upper eyelid near the top of the lid that initially seems to just be a scratch but when manipulated opens up wide.  However it is superficial and only involves epidermis. Head: Atraumatic other than eyelid laceration. Nose: No congestion/rhinnorhea. Mouth/Throat: Patient is wearing a mask. Neck: No stridor.  No meningeal signs.   Cardiovascular: Normal rate, regular rhythm. Good peripheral circulation. Respiratory: Normal respiratory effort.  No retractions. Musculoskeletal: No lower extremity tenderness nor edema. No gross deformities of extremities. Neurologic:  Normal speech and language. No gross focal neurologic deficits are appreciated.  Skin:  Skin is warm, dry and intact other than  eyelid laceration.  ____________________________________________   LABS (all labs ordered are listed, but only abnormal results are displayed)  Labs Reviewed - No data to display ____________________________________________    PROCEDURES   Procedure(s) performed (including Critical Care):  Marland KitchenMarland KitchenLaceration Repair  Date/Time: 05/06/2020 1:14 AM Performed by: Loleta Rose, MD Authorized by: Loleta Rose, MD   Consent:    Consent obtained:  Verbal   Consent given by:  Patient   Risks discussed:  Infection and pain Anesthesia:    Anesthesia method:  None Laceration details:    Location:  Face   Face location:  R upper eyelid   Extent:  Superficial   Length (cm):  1.3 Exploration:    Limited defect created (wound extended): no     Contaminated: no   Treatment:    Amount of cleaning:  Extensive   Irrigation solution:  Sterile saline   Visualized foreign bodies/material removed: no   Skin repair:    Repair method:  Tissue adhesive Approximation:    Approximation:  Close Repair type:    Repair type:  Simple Post-procedure details:    Dressing:  Open (no dressing)   Procedure completion:  Tolerated well, no immediate complications     ____________________________________________   INITIAL IMPRESSION / MDM / ASSESSMENT AND PLAN / ED COURSE  As part of my medical decision making, I reviewed the following data within the electronic MEDICAL RECORD NUMBER Nursing notes reviewed and incorporated and Notes from prior ED visits  Superficial laceration of the right eyelid, well approximated.  Appropriate for Dermabond.  Repaired without issue.  Given the nature of the wound (caused by a dog claw) and the location, I am treating him with 5 days of Keflex prophylactically.  He agrees with the plan.  First dose given in the ED.  I gave my usual and customary follow-up recommendations and return precautions.          ____________________________________________  FINAL CLINICAL  IMPRESSION(S) / ED DIAGNOSES  Final diagnoses:  Right eyelid laceration, initial encounter     MEDICATIONS GIVEN DURING THIS VISIT:  Medications  cephALEXin (KEFLEX) capsule 500 mg (500 mg Oral Given 05/06/20 0006)     ED Discharge Orders         Ordered    cephALEXin (KEFLEX) 500 MG capsule  4 times daily        05/06/20 0007          *Please note:  Akon Reinoso was evaluated in Emergency Department on 05/06/2020 for the symptoms described in the history of present illness. He was evaluated in the context of the global COVID-19 pandemic, which necessitated consideration that the patient might be at risk for infection with  the SARS-CoV-2 virus that causes COVID-19. Institutional protocols and algorithms that pertain to the evaluation of patients at risk for COVID-19 are in a state of rapid change based on information released by regulatory bodies including the CDC and federal and state organizations. These policies and algorithms were followed during the patient's care in the ED.  Some ED evaluations and interventions may be delayed as a result of limited staffing during and after the pandemic.*  Note:  This document was prepared using Dragon voice recognition software and may include unintentional dictation errors.   Loleta Rose, MD 05/06/20 [phone]

## 2020-07-12 IMAGING — DX DG CHEST 1V PORT
1 series · 1 of 1 positions shown · non-contrast
Comparison: None.

CLINICAL DATA: Cough and shortness of breath

EXAM:
PORTABLE CHEST 1 VIEW

[chest ap]
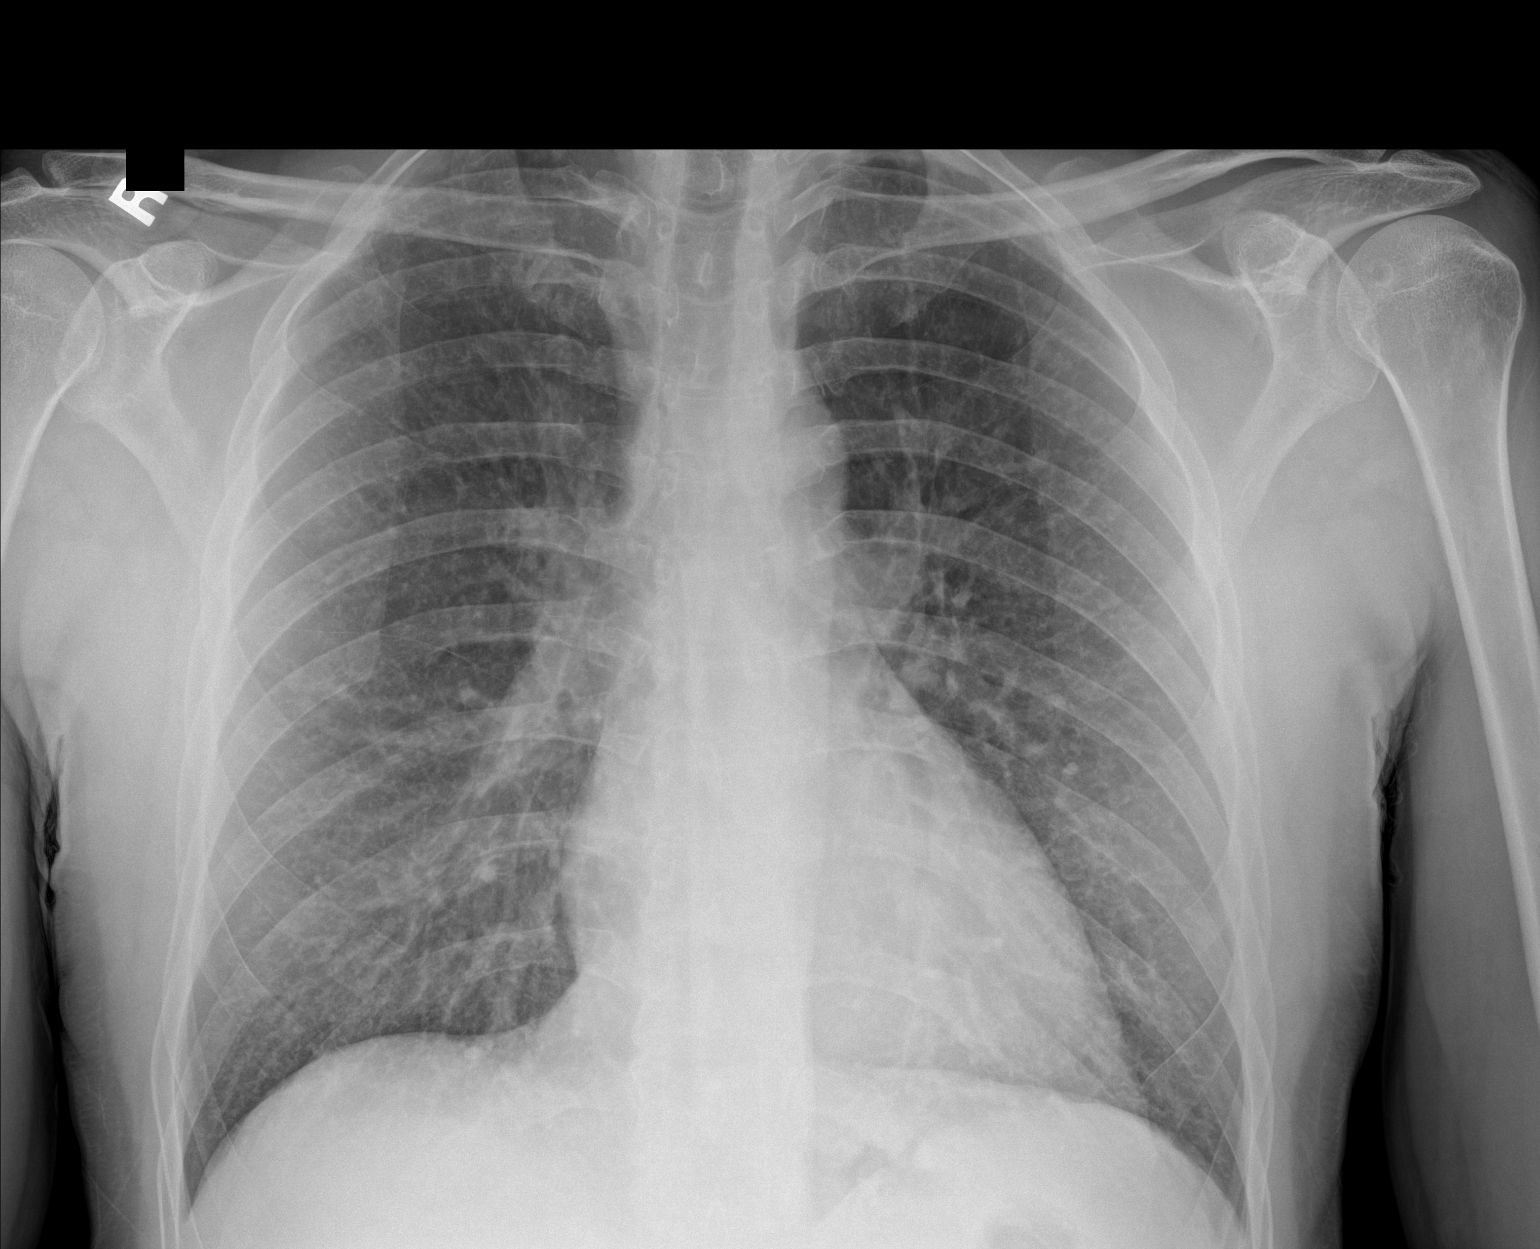

[1 of 1 positions shown; findings below may reference images not displayed]

FINDINGS: There is mild apical pleural thickening bilaterally. Lungs elsewhere
clear. Heart size and pulmonary vascularity are normal. No
adenopathy. No bone lesions.
IMPRESSION: Apical pleural thickening noted. Lungs elsewhere clear. Cardiac
silhouette normal. No evident adenopathy.

## 2020-07-16 ENCOUNTER — Other Ambulatory Visit: Payer: Self-pay

## 2020-07-16 ENCOUNTER — Emergency Department
Admission: EM | Admit: 2020-07-16 | Discharge: 2020-07-16 | Disposition: A | Discharge status: Home / Self Care | Payer: Medicaid Other | Attending: Emergency Medicine | Admitting: Emergency Medicine

## 2020-07-16 DIAGNOSIS — W57XXXA Bitten or stung by nonvenomous insect and other nonvenomous arthropods, initial encounter: Secondary | ICD-10-CM | POA: Insufficient documentation

## 2020-07-16 DIAGNOSIS — Z87891 Personal history of nicotine dependence: Secondary | ICD-10-CM | POA: Insufficient documentation

## 2020-07-16 DIAGNOSIS — S70362A Insect bite (nonvenomous), left thigh, initial encounter: Secondary | ICD-10-CM | POA: Insufficient documentation

## 2020-07-16 DIAGNOSIS — A6920 Lyme disease, unspecified: Secondary | ICD-10-CM

## 2020-07-16 MED ORDER — DOXYCYCLINE HYCLATE 100 MG PO TABS
100.0000 mg | ORAL_TABLET | Freq: Once | ORAL | Status: AC
  Administered 2020-07-16: 100 mg via ORAL
  Filled 2020-07-16: qty 1

## 2020-07-16 MED ORDER — DOXYCYCLINE HYCLATE 100 MG PO TABS: 100.0000 mg | ORAL_TABLET | Freq: Two times a day (BID) | ORAL | 0 refills | Status: AC

## 2020-07-16 NOTE — ED Triage Notes (Signed)
Pt states that he pulled a tick off approx 2 weeks ago and now has a red ring around the bite mark

## 2020-07-16 NOTE — ED Provider Notes (Signed)
[facility] Emergency Department Provider Note   ____________________________________________    I have reviewed the triage vital signs and the nursing notes.   HISTORY  Chief Complaint Insect Bite     HPI Dwayne Adams is a 40 y.o. male who reports that he had tick bite to his left thigh approximately 1 week ago, today noticed a red ring around the area.  No other symptoms.  Past Medical History:  Diagnosis Date  . Laceration of hand 02/2017   R hand     There are no problems to display for this patient.   Past Surgical History:  Procedure Laterality Date  . HAND SURGERY Right     Prior to Admission medications   Medication Sig Start Date End Date Taking? Authorizing Provider  doxycycline (VIBRA-TABS) 100 MG tablet Take 1 tablet (100 mg total) by mouth 2 (two) times daily. 07/16/20  Yes Jene Every, MD  azithromycin (ZITHROMAX Z-PAK) 250 MG tablet 2 pills today then 1 pill a day for 4 days 02/24/19   Sherrie Mustache, Roselyn Bering, PA-C  predniSONE (STERAPRED UNI-PAK 21 TAB) 10 MG (21) TBPK tablet Take 6 pills on day one then decrease by 1 pill each day 02/24/19   Faythe Ghee, PA-C     Allergies Patient has no known allergies.  No family history on file.  Social History Social History   Tobacco Use  . Smoking status: Former Smoker    Types: E-cigarettes, Cigarettes  . Smokeless tobacco: Never Used  Vaping Use  . Vaping Use: Every day  . Start date: 09/13/2014  Substance Use Topics  . Alcohol use: No  . Drug use: No    Review of Systems  Constitutional: No fever/chills     Gastrointestinal: No abdominal pain.  No nausea, no vomiting.    Musculoskeletal: Negative for back pain. Skin: As above Neurological: Negative for headaches     ____________________________________________   PHYSICAL EXAM:  VITAL SIGNS: ED Triage Vitals  Enc Vitals Group     BP 07/16/20 1854 (!) 143/101     Pulse Rate 07/16/20 1854 96     Resp  07/16/20 1854 16     Temp 07/16/20 1854 98.4 F (36.9 C)     Temp Source 07/16/20 1854 Oral     SpO2 07/16/20 1854 100 %     Weight 07/16/20 1855 93.4 kg (206 lb)     Height 07/16/20 1855 1.829 m (6')     Head Circumference --      Peak Flow --      Pain Score 07/16/20 1854 0     Pain Loc --      Pain Edu? --      Excl. in GC? --      Constitutional: Alert and oriented.   Nose: No congestion/rhinnorhea. Mouth/Throat: Mucous membranes are moist.   Cardiovascular: Normal rate, regular rhythm.  Respiratory: Normal respiratory effort.  No retractions. Genitourinary: deferred Musculoskeletal: No lower extremity tenderness nor edema.   Neurologic:  Normal speech and language. No gross focal neurologic deficits are appreciated.   Skin:  Skin is warm, dry and intact.  Erythematous circular lesion around site of where patient removed tick   ____________________________________________   LABS (all labs ordered are listed, but only abnormal results are displayed)  Labs Reviewed - No data to display ____________________________________________  EKG   ____________________________________________  RADIOLOGY   ____________________________________________   PROCEDURES  Procedure(s) performed: No  Procedures   Critical Care performed:  No ____________________________________________   INITIAL IMPRESSION / ASSESSMENT AND PLAN / ED COURSE  Pertinent labs & imaging results that were available during my care of the patient were reviewed by me and considered in my medical decision making (see chart for details).  Erythema migrans rash in the setting of tick bite, doxycycline given here, 7 days of doxycycline   ____________________________________________   FINAL CLINICAL IMPRESSION(S) / ED DIAGNOSES  Final diagnoses:  Lyme disease      NEW MEDICATIONS STARTED DURING THIS VISIT:  Discharge Medication List as of 07/16/2020  7:55 PM    START taking these  medications   Details  doxycycline (VIBRA-TABS) 100 MG tablet Take 1 tablet (100 mg total) by mouth 2 (two) times daily., Starting Mon 07/16/2020, Normal         Note:  This document was prepared using Dragon voice recognition software and may include unintentional dictation errors.   Jene Every, MD 07/16/20 2041

## 2023-05-08 ENCOUNTER — Other Ambulatory Visit: Payer: Self-pay | Admitting: Physical Medicine and Rehabilitation

## 2023-05-08 DIAGNOSIS — M5416 Radiculopathy, lumbar region: Secondary | ICD-10-CM

## 2023-05-11 ENCOUNTER — Encounter: Payer: Self-pay | Admitting: Physical Medicine and Rehabilitation

## 2023-05-12 ENCOUNTER — Ambulatory Visit
Admission: RE | Admit: 2023-05-12 | Discharge: 2023-05-12 | Disposition: A | Discharge status: Home / Self Care | Source: Ambulatory Visit | Attending: Physical Medicine and Rehabilitation | Admitting: Physical Medicine and Rehabilitation

## 2023-05-12 DIAGNOSIS — M5416 Radiculopathy, lumbar region: Secondary | ICD-10-CM
